# Patient Record
Sex: Female | Born: 1995 | Race: Black or African American | Hispanic: No | Marital: Single | Smoking: Never smoker
Health system: Southern US, Community
[De-identification: ages and names within clinical notes are randomized; demographics above are authoritative.]

## PROBLEM LIST (undated history)

## (undated) DIAGNOSIS — Z789 Other specified health status: Secondary | ICD-10-CM

## (undated) DIAGNOSIS — D649 Anemia, unspecified: Secondary | ICD-10-CM

## (undated) HISTORY — PX: NO PAST SURGERIES: SHX2092

---
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
## 2016-03-17 ENCOUNTER — Encounter: Payer: Self-pay | Admitting: Certified Nurse Midwife

## 2016-03-17 ENCOUNTER — Encounter: Payer: Medicaid Other | Admitting: Certified Nurse Midwife

## 2016-03-17 DIAGNOSIS — Z3490 Encounter for supervision of normal pregnancy, unspecified, unspecified trimester: Secondary | ICD-10-CM | POA: Insufficient documentation

## 2016-03-20 LAB — OB RESULTS CONSOLE RUBELLA ANTIBODY, IGM: Rubella: IMMUNE

## 2016-03-20 LAB — OB RESULTS CONSOLE HEPATITIS B SURFACE ANTIGEN: HEP B S AG: NEGATIVE

## 2016-05-05 NOTE — L&D Delivery Note (Signed)
Delivery Note Pt pushed very well for for delivery. At 1:36 PM a viable and healthy female was delivered via Vaginal, Spontaneous Delivery (Presentation: OA; LOT  ).  APGAR: 1, 7; weight P  .   Placenta status: delivered, intact .  Cord: 3V with the following complications: none.  Cord pH: 7.00  NICU assessed and cleared baby after drying, stimulation, PPV.    Anesthesia:  epidural Episiotomy: None Lacerations: 2nd degree;Periurethral Suture Repair: 3.0 vicryl rapide; hemostatic Est. Blood Loss (mL): 400cc  Mom to postpartum.  Baby to Couplet care / Skin to Skin.  Krista Adkins 09/29/2016, 2:01 PM  D/w pt circumcision for female infant including r/b/a.  To contact office  Br/O+/RI/Contra?/Tdap in [facility]

## 2016-07-16 LAB — OB RESULTS CONSOLE RPR: RPR: NONREACTIVE

## 2016-07-16 LAB — OB RESULTS CONSOLE HIV ANTIBODY (ROUTINE TESTING): HIV: NONREACTIVE

## 2016-09-11 LAB — OB RESULTS CONSOLE GBS: STREP GROUP B AG: NEGATIVE

## 2016-09-11 LAB — OB RESULTS CONSOLE GC/CHLAMYDIA
Chlamydia: NEGATIVE
Gonorrhea: NEGATIVE

## 2016-09-29 ENCOUNTER — Inpatient Hospital Stay (HOSPITAL_COMMUNITY)
Admission: AD | Admit: 2016-09-29 | Discharge: 2016-10-01 | DRG: 775 | Disposition: A | Discharge status: Home / Self Care | Payer: Medicaid Other | Source: Ambulatory Visit | Attending: Obstetrics and Gynecology | Admitting: Obstetrics and Gynecology

## 2016-09-29 ENCOUNTER — Inpatient Hospital Stay (HOSPITAL_COMMUNITY): Payer: Medicaid Other | Admitting: Anesthesiology

## 2016-09-29 ENCOUNTER — Encounter (HOSPITAL_COMMUNITY): Payer: Self-pay | Admitting: *Deleted

## 2016-09-29 DIAGNOSIS — Z3A38 38 weeks gestation of pregnancy: Secondary | ICD-10-CM

## 2016-09-29 DIAGNOSIS — D649 Anemia, unspecified: Secondary | ICD-10-CM | POA: Diagnosis present

## 2016-09-29 DIAGNOSIS — O9902 Anemia complicating childbirth: Secondary | ICD-10-CM | POA: Diagnosis present

## 2016-09-29 DIAGNOSIS — O1494 Unspecified pre-eclampsia, complicating childbirth: Secondary | ICD-10-CM | POA: Diagnosis present

## 2016-09-29 HISTORY — DX: Other specified health status: Z78.9

## 2016-09-29 HISTORY — DX: Anemia, unspecified: D64.9

## 2016-09-29 LAB — CBC
HEMATOCRIT: 29.4 % — AB (ref 36.0–46.0)
HEMATOCRIT: 24.2 % — AB (ref 36.0–46.0)
HEMOGLOBIN: 9.1 g/dL — AB (ref 12.0–15.0)
Hemoglobin: 7.7 g/dL — ABNORMAL LOW (ref 12.0–15.0)
MCH: 24.1 pg — ABNORMAL LOW (ref 26.0–34.0)
MCH: 24.9 pg — AB (ref 26.0–34.0)
MCHC: 31 g/dL (ref 30.0–36.0)
MCHC: 31.8 g/dL (ref 30.0–36.0)
MCV: 77.8 fL — AB (ref 78.0–100.0)
MCV: 78.3 fL (ref 78.0–100.0)
Platelets: 251 10*3/uL (ref 150–400)
Platelets: 242 10*3/uL (ref 150–400)
RBC: 3.78 MIL/uL — AB (ref 3.87–5.11)
RBC: 3.09 MIL/uL — ABNORMAL LOW (ref 3.87–5.11)
RDW: 15.7 % — AB (ref 11.5–15.5)
RDW: 15.9 % — AB (ref 11.5–15.5)
WBC: 9.7 10*3/uL (ref 4.0–10.5)
WBC: 15.2 10*3/uL — ABNORMAL HIGH (ref 4.0–10.5)

## 2016-09-29 LAB — TYPE AND SCREEN
ABO/RH(D): O POS
ANTIBODY SCREEN: NEGATIVE

## 2016-09-29 LAB — ABO/RH: ABO/RH(D): O POS

## 2016-09-29 LAB — RPR: RPR Ser Ql: NONREACTIVE

## 2016-09-29 MED ORDER — LIDOCAINE HCL (PF) 1 % IJ SOLN
30.0000 mL | INTRAMUSCULAR | Status: DC | PRN
  Filled 2016-09-29: qty 30

## 2016-09-29 MED ORDER — OXYCODONE-ACETAMINOPHEN 5-325 MG PO TABS: 2.0000 | ORAL_TABLET | ORAL | Status: DC | PRN

## 2016-09-29 MED ORDER — IBUPROFEN 600 MG PO TABS
600.0000 mg | ORAL_TABLET | Freq: Four times a day (QID) | ORAL | Status: DC
  Administered 2016-09-29 – 2016-10-01 (×9): 600 mg via ORAL
  Filled 2016-09-29 (×9): qty 1

## 2016-09-29 MED ORDER — ONDANSETRON HCL 4 MG PO TABS: 4.0000 mg | ORAL_TABLET | ORAL | Status: DC | PRN

## 2016-09-29 MED ORDER — ONDANSETRON HCL 4 MG/2ML IJ SOLN: 4.0000 mg | Freq: Four times a day (QID) | INTRAMUSCULAR | Status: DC | PRN

## 2016-09-29 MED ORDER — OXYTOCIN 40 UNITS IN LACTATED RINGERS INFUSION - SIMPLE MED: 2.5000 [IU]/h | INTRAVENOUS | Status: DC

## 2016-09-29 MED ORDER — OXYTOCIN BOLUS FROM INFUSION
500.0000 mL | Freq: Once | INTRAVENOUS | Status: AC
  Administered 2016-09-29: 500 mL via INTRAVENOUS

## 2016-09-29 MED ORDER — FLEET ENEMA 7-19 GM/118ML RE ENEM: 1.0000 | ENEMA | RECTAL | Status: DC | PRN

## 2016-09-29 MED ORDER — SENNOSIDES-DOCUSATE SODIUM 8.6-50 MG PO TABS
2.0000 | ORAL_TABLET | ORAL | Status: DC
  Administered 2016-09-29 – 2016-09-30 (×2): 2 via ORAL
  Filled 2016-09-29 (×2): qty 2

## 2016-09-29 MED ORDER — SOD CITRATE-CITRIC ACID 500-334 MG/5ML PO SOLN: 30.0000 mL | ORAL | Status: DC | PRN

## 2016-09-29 MED ORDER — OXYTOCIN 40 UNITS IN LACTATED RINGERS INFUSION - SIMPLE MED
1.0000 m[IU]/min | INTRAVENOUS | Status: DC
  Administered 2016-09-29: 2 m[IU]/min via INTRAVENOUS
  Filled 2016-09-29: qty 1000

## 2016-09-29 MED ORDER — DIPHENHYDRAMINE HCL 50 MG/ML IJ SOLN: 12.5000 mg | INTRAMUSCULAR | Status: DC | PRN

## 2016-09-29 MED ORDER — LACTATED RINGERS IV SOLN
INTRAVENOUS | Status: DC
  Administered 2016-09-29 (×2): via INTRAVENOUS

## 2016-09-29 MED ORDER — ONDANSETRON HCL 4 MG/2ML IJ SOLN: 4.0000 mg | INTRAMUSCULAR | Status: DC | PRN

## 2016-09-29 MED ORDER — PHENYLEPHRINE 40 MCG/ML (10ML) SYRINGE FOR IV PUSH (FOR BLOOD PRESSURE SUPPORT)
80.0000 ug | PREFILLED_SYRINGE | INTRAVENOUS | Status: DC | PRN
  Filled 2016-09-29: qty 5

## 2016-09-29 MED ORDER — PRENATAL MULTIVITAMIN CH
1.0000 | ORAL_TABLET | Freq: Every day | ORAL | Status: DC
  Administered 2016-09-30 – 2016-10-01 (×2): 1 via ORAL
  Filled 2016-09-29 (×2): qty 1

## 2016-09-29 MED ORDER — LIDOCAINE HCL (PF) 1 % IJ SOLN
INTRAMUSCULAR | Status: DC | PRN
Start: 2016-09-29 — End: 2016-09-29
  Administered 2016-09-29 (×2): 7 mL via EPIDURAL

## 2016-09-29 MED ORDER — TERBUTALINE SULFATE 1 MG/ML IJ SOLN
0.2500 mg | Freq: Once | INTRAMUSCULAR | Status: DC | PRN
  Filled 2016-09-29: qty 1

## 2016-09-29 MED ORDER — HYDRALAZINE HCL 20 MG/ML IJ SOLN: 10.0000 mg | Freq: Once | INTRAMUSCULAR | Status: DC | PRN

## 2016-09-29 MED ORDER — SIMETHICONE 80 MG PO CHEW
80.0000 mg | CHEWABLE_TABLET | ORAL | Status: DC | PRN
Start: 2016-09-29 — End: 2016-10-01

## 2016-09-29 MED ORDER — ZOLPIDEM TARTRATE 5 MG PO TABS: 5.0000 mg | ORAL_TABLET | Freq: Every evening | ORAL | Status: DC | PRN

## 2016-09-29 MED ORDER — COCONUT OIL OIL
1.0000 "application " | TOPICAL_OIL | Status: DC | PRN
  Administered 2016-09-30: 1 via TOPICAL
  Filled 2016-09-29: qty 120

## 2016-09-29 MED ORDER — BUTORPHANOL TARTRATE 1 MG/ML IJ SOLN: 1.0000 mg | INTRAMUSCULAR | Status: DC | PRN

## 2016-09-29 MED ORDER — WITCH HAZEL-GLYCERIN EX PADS: 1.0000 "application " | MEDICATED_PAD | CUTANEOUS | Status: DC | PRN

## 2016-09-29 MED ORDER — OXYCODONE HCL 5 MG PO TABS: 10.0000 mg | ORAL_TABLET | ORAL | Status: DC | PRN

## 2016-09-29 MED ORDER — OXYCODONE-ACETAMINOPHEN 5-325 MG PO TABS: 1.0000 | ORAL_TABLET | ORAL | Status: DC | PRN

## 2016-09-29 MED ORDER — LABETALOL HCL 5 MG/ML IV SOLN
20.0000 mg | INTRAVENOUS | Status: DC | PRN
  Administered 2016-09-29: 20 mg via INTRAVENOUS
  Filled 2016-09-29: qty 4

## 2016-09-29 MED ORDER — OXYCODONE HCL 5 MG PO TABS: 5.0000 mg | ORAL_TABLET | ORAL | Status: DC | PRN

## 2016-09-29 MED ORDER — ACETAMINOPHEN 325 MG PO TABS: 650.0000 mg | ORAL_TABLET | ORAL | Status: DC | PRN

## 2016-09-29 MED ORDER — EPHEDRINE 5 MG/ML INJ
10.0000 mg | INTRAVENOUS | Status: DC | PRN
  Filled 2016-09-29: qty 2

## 2016-09-29 MED ORDER — LACTATED RINGERS IV SOLN: 500.0000 mL | Freq: Once | INTRAVENOUS | Status: DC

## 2016-09-29 MED ORDER — FENTANYL 2.5 MCG/ML BUPIVACAINE 1/10 % EPIDURAL INFUSION (WH - ANES)
14.0000 mL/h | INTRAMUSCULAR | Status: DC | PRN
  Administered 2016-09-29: 14 mL/h via EPIDURAL
  Filled 2016-09-29: qty 100

## 2016-09-29 MED ORDER — BENZOCAINE-MENTHOL 20-0.5 % EX AERO
1.0000 "application " | INHALATION_SPRAY | CUTANEOUS | Status: DC | PRN
  Filled 2016-09-29: qty 56

## 2016-09-29 MED ORDER — LACTATED RINGERS IV SOLN: INTRAVENOUS | Status: DC

## 2016-09-29 MED ORDER — ACETAMINOPHEN 325 MG PO TABS
650.0000 mg | ORAL_TABLET | ORAL | Status: DC | PRN
  Administered 2016-10-01: 650 mg via ORAL
  Filled 2016-09-29: qty 2

## 2016-09-29 MED ORDER — PHENYLEPHRINE 40 MCG/ML (10ML) SYRINGE FOR IV PUSH (FOR BLOOD PRESSURE SUPPORT)
80.0000 ug | PREFILLED_SYRINGE | INTRAVENOUS | Status: DC | PRN
  Filled 2016-09-29: qty 10
  Filled 2016-09-29: qty 5

## 2016-09-29 MED ORDER — DIBUCAINE 1 % RE OINT: 1.0000 "application " | TOPICAL_OINTMENT | RECTAL | Status: DC | PRN

## 2016-09-29 MED ORDER — LACTATED RINGERS IV SOLN: 500.0000 mL | INTRAVENOUS | Status: DC | PRN

## 2016-09-29 MED ORDER — DIPHENHYDRAMINE HCL 25 MG PO CAPS: 25.0000 mg | ORAL_CAPSULE | Freq: Four times a day (QID) | ORAL | Status: DC | PRN

## 2016-09-29 NOTE — MAU Note (Signed)
Pt reports contractions every 5 mins since about 530-600. Pt denies LOF. Reports some vaginal spotting and good fetal movement. Cervix was 3cm on Thursday.

## 2016-09-29 NOTE — Anesthesia Preprocedure Evaluation (Signed)
Anesthesia Evaluation  Patient identified by MRN, date of birth, ID band Patient awake    Reviewed: Allergy & Precautions, H&P , NPO status , Patient's Chart, lab work & pertinent test results  Airway Mallampati: II  TM Distance: >3 FB Neck ROM: full    Dental no notable dental hx.    Pulmonary neg pulmonary ROS,    Pulmonary exam normal breath sounds clear to auscultation       Cardiovascular negative cardio ROS Normal cardiovascular exam Rhythm:regular Rate:Normal     Neuro/Psych negative neurological ROS  negative psych ROS   GI/Hepatic negative GI ROS, Neg liver ROS,   Endo/Other  negative endocrine ROS  Renal/GU negative Renal ROS  negative genitourinary   Musculoskeletal   Abdominal (+) + obese,   Peds  Hematology  (+) Blood dyscrasia, anemia ,   Anesthesia Other Findings   Reproductive/Obstetrics (+) Pregnancy                             Anesthesia Physical Anesthesia Plan  ASA: II  Anesthesia Plan: Epidural   Post-op Pain Management:    Induction:   Airway Management Planned:   Additional Equipment:   Intra-op Plan:   Post-operative Plan:   Informed Consent: I have reviewed the patients History and Physical, chart, labs and discussed the procedure including the risks, benefits and alternatives for the proposed anesthesia with the patient or authorized representative who has indicated his/her understanding and acceptance.     Plan Discussed with:   Anesthesia Plan Comments:         Anesthesia Quick Evaluation

## 2016-09-29 NOTE — H&P (Signed)
Krista Adkins is a 21 y.o. female G1P0 at 38+ with ctx.  Pregnancy complicated by PreEclampsia by 24 hr urine 2 g.  No other sx's.  Presents to MAU without sx's and with cervical change.  Received Tdap in [facility].  Preg dayted by LMP cw 1st trimester [place].  Also + chlamydia had neg TOC.     OB History    Gravida Para Term Preterm AB Living   1             SAB TAB Ectopic Multiple Live Births                G1 present No pap + Chl - neg TOC  Past Medical History:  Diagnosis Date  . Medical history non-contributory   seasonal allergiess  Past Surgical History:  Procedure Laterality Date  . NO PAST SURGERIES     Family History: family history is non contributory.   Social History:  reports that she has never smoked. She has never used smokeless tobacco. She reports that she does not drink alcohol or use drugs. in college, single Meds PNV All NKDA     Maternal Diabetes: No Genetic Screening: Normal Maternal Ultrasounds/Referrals: Normal Fetal Ultrasounds or other Referrals:  None Maternal Substance Abuse:  No Significant Maternal Medications:  None Significant Maternal Lab Results:  Lab values include: Group B Strep negative Other Comments:  None  Review of Systems  Constitutional: Negative.   HENT: Negative.   Eyes: Negative.   Respiratory: Negative.   Cardiovascular: Negative.   Gastrointestinal: Negative.   Genitourinary: Negative.   Musculoskeletal: Positive for back pain.  Skin: Negative.   Neurological: Negative.   Psychiatric/Behavioral: Negative.    Maternal Medical History:  Reason for admission: Contractions.   Contractions: Frequency: irregular.    Fetal activity: Perceived fetal activity is normal.    Prenatal complications: Pre-eclampsia.   Prenatal Complications - Diabetes: none.    Dilation: 4 Effacement (%): 70 Exam by:: Janeth Rase RNC Blood pressure (!) 155/93, pulse 85, temperature 98.3 F (36.8 C), temperature source Oral, resp. rate  17, height 5\' 6"  (1.676 m), weight 93.9 kg (207 lb), SpO2 100 %. Maternal Exam:  Abdomen: Patient reports no abdominal tenderness. Fundal height is appropriate for gestation.   Estimated fetal weight is 6.5-7.5#.   Fetal presentation: vertex  Introitus: Normal vulva. Normal vagina.  Cervix: Cervix evaluated by digital exam.     Physical Exam  Constitutional: She is oriented to person, place, and time. She appears well-developed and well-nourished.  HENT:  Head: Normocephalic and atraumatic.  Cardiovascular: Normal rate and regular rhythm.   Respiratory: Breath sounds normal. No respiratory distress. She has no wheezes.  GI: Soft. Bowel sounds are normal. She exhibits no distension. There is no tenderness.  Musculoskeletal: Normal range of motion.  Neurological: She is alert and oriented to person, place, and time.  Skin: Skin is warm and dry.  Psychiatric: She has a normal mood and affect. Her behavior is normal.    Prenatal labs: ABO, Rh:  O+ Antibody:  neg Rubella:  immune RPR:   NR HBsAg:   neg HIV:   neg GBS:   neg  Hgb 12.2/Plt 361/Ur Cx neg/ GC neg/Chl + - TOC neg/Varicella immune/nl Hgb electro/glucola 153 - nl 3 hr GTT/essential panel neg/nl NT/First trimester screen WNL/US nl anat, post plac, female  OreE with 2 gm protein  Tdap 3/14  Assessment/Plan: 20yo G1 with PreE and labor Monitor BP closely IV pain meds, nitrous  or epidural PRN Expect SVD Augment with AROM/pitocin   Kahil Agner Bovard-Stuckert 09/29/2016, 7:56 AM

## 2016-09-29 NOTE — Anesthesia Pain Management Evaluation Note (Signed)
  CRNA Pain Management Visit Note  Patient: Krista Adkins, 21 y.o., female  "Hello I am a member of the anesthesia team at [facility]. We have an anesthesia team available at all times to provide care throughout the hospital, including epidural management and anesthesia for C-section. I don't know your plan for the delivery whether it a natural birth, water birth, IV sedation, nitrous supplementation, doula or epidural, but we want to meet your pain goals."   1.Was your pain managed to your expectations on prior hospitalizations?   No prior hospitalizations  2.What is your expectation for pain management during this hospitalization?     Epidural  3.How can we help you reach that goal? Epidural when ready.  Record the patient's initial score and the patient's pain goal.   Pain: 6  Pain Goal: 8 [facility] wants you to be able to say your pain was always managed very well.  Meila Berke L 09/29/2016

## 2016-09-29 NOTE — Lactation Note (Signed)
This note was copied from a baby's chart. Lactation Consultation Note  Patient Name: Krista Adkins [id number] Date: 09/29/2016 Reason for consult: Initial assessment  Consult attempted, but Mom has multiple visitors in room. Mom has my # to call when ready for consult.  Lurline Hare [facility] 09/29/2016, 6:36 PM

## 2016-09-29 NOTE — Lactation Note (Addendum)
This note was copied from a baby's chart. Lactation Consultation Note  Patient Name: Boy Kenyell Sirak [id number] Date: 09/29/2016 Reason for consult: Initial assessment  Patient called out for assist. Mom assisted w/positioning. Infant latched w/relative ease, but did require lowering of mandible to widen gape. It was a short feeding, but frequent swallows were noted. Mom was comfortable w/latch. Signs of swallowing discussed.  Mom reports + breast changes w/pregnancy, but her anatomy suggests that her lower quadrants may not be as developed.  Mom made aware of O/P services, breastfeeding support groups, community resources, and our phone # for post-discharge questions. Specifics of an asymmetric latch shown via [employer].  Lurline Hare [facility] 09/29/2016, 8:52 PM

## 2016-09-29 NOTE — Anesthesia Postprocedure Evaluation (Signed)
Anesthesia Post Note  Patient: Krista Adkins  Procedure(s) Performed: * No procedures listed *  Patient location during evaluation: Mother Baby Anesthesia Type: Epidural Level of consciousness: awake and alert and oriented Pain management: satisfactory to patient Vital Signs Assessment: post-procedure vital signs reviewed and stable Respiratory status: spontaneous breathing and nonlabored ventilation Cardiovascular status: stable Postop Assessment: no headache, no backache, no signs of nausea or vomiting, adequate PO intake and patient able to bend at knees (patient up walking) Anesthetic complications: no        Last Vitals:  Vitals:   09/29/16 1700 09/29/16 2057  BP: (!) 141/80 137/67  Pulse: 97 (!) 102  Resp: 18 16  Temp: 37.5 C 37.3 C    Last Pain:  Vitals:   09/29/16 2057  TempSrc: Oral  PainSc: 0-No pain   Pain Goal:                 Oracio Galen

## 2016-09-29 NOTE — Progress Notes (Signed)
Patient ID: Krista Adkins, female   DOB: Aug 23, 1995, 21 y.o.   MRN: [id number]   Comfortable with epidural  AF VSS, some elevated BP 128-153/94-105  gen NAD FHTs 135-140, mod var, + accels, category 1 toco q 3-3min  AROM for clear fluid, w/o diff/comp  SVE 6/90/0  Continue IOL

## 2016-09-29 NOTE — Anesthesia Procedure Notes (Signed)
Epidural Patient location during procedure: OB Start time: 09/29/2016 9:48 AM End time: 09/29/2016 9:52 AM  Staffing Anesthesiologist: Leilani Able Performed: anesthesiologist   Preanesthetic Checklist Completed: patient identified, surgical consent, pre-op evaluation, timeout performed, IV checked, risks and benefits discussed and monitors and equipment checked  Epidural Patient position: sitting Prep: site prepped and draped and DuraPrep Patient monitoring: continuous pulse ox and blood pressure Approach: midline Location: L3-L4 Injection technique: LOR air  Needle:  Needle type: Tuohy  Needle gauge: 17 G Needle length: 9 cm and 9 Needle insertion depth: 6 cm Catheter type: closed end flexible Catheter size: 19 Gauge Catheter at skin depth: 12 cm Test dose: negative and Other  Assessment Sensory level: T9 Events: blood not aspirated, injection not painful, no injection resistance, negative IV test and no paresthesia  Additional Notes Reason for block:procedure for pain

## 2016-09-29 NOTE — Progress Notes (Signed)
First time OOB to bathroom with assistance, Voided and emptied bladder  wtihout difficulty, peri care given.  No further c/o

## 2016-09-30 LAB — CBC
HCT: 21.2 % — ABNORMAL LOW (ref 36.0–46.0)
HEMOGLOBIN: 6.9 g/dL — AB (ref 12.0–15.0)
MCH: 25.3 pg — AB (ref 26.0–34.0)
MCHC: 32.5 g/dL (ref 30.0–36.0)
MCV: 77.7 fL — AB (ref 78.0–100.0)
Platelets: 202 10*3/uL (ref 150–400)
RBC: 2.73 MIL/uL — ABNORMAL LOW (ref 3.87–5.11)
RDW: 16.1 % — AB (ref 11.5–15.5)
WBC: 13.5 10*3/uL — ABNORMAL HIGH (ref 4.0–10.5)

## 2016-09-30 MED ORDER — FERROUS SULFATE 325 (65 FE) MG PO TABS
325.0000 mg | ORAL_TABLET | Freq: Two times a day (BID) | ORAL | Status: DC
  Administered 2016-09-30 – 2016-10-01 (×3): 325 mg via ORAL
  Filled 2016-09-30 (×3): qty 1

## 2016-09-30 NOTE — Progress Notes (Signed)
Patient ID: Krista Adkins, female   DOB: [birth year], 21 y.o.   MRN: [id number] Pt doing well. Sore - pain controlled with ibuprofen. No fever, chills, HA or blurry vision. She is bonding well with baby. Ambulating and voiding well. Lochia moderate VS 130-145/83-84 ABD - soft, FF EXT - +1 edema b/l, no homans  13.5>6.9<202  A/P:PPD#1 sp svd - anemic and mildly elev bp but stable       Will start on oral meds for BP if SBP>150 or DBP>90       Will do circumcision for baby later today        Routine pp care- likely discharge to home tomorrow

## 2016-09-30 NOTE — Progress Notes (Signed)
Critical hgb 6.9. Dr. Hinton Rao paged. Patient asymptomatic.

## 2016-09-30 NOTE — Plan of Care (Signed)
Problem: Education: Goal: Knowledge of the prescribed therapeutic regimen will improve Outcome: Progressing Patient has been resting and napping at regular intervals today per recommendations. Patient verbalizes understanding to report headaches, visual disturbances, nausea, epigastric discomfort, and/or vomiting to the nursing staff. Patient's family supportive of this prescribed regimen.

## 2016-09-30 NOTE — Lactation Note (Signed)
This note was copied from a baby's chart. Lactation Consultation Note  Patient Name: Krista Adkins [id number] Date: 09/30/2016 Reason for consult: Follow-up assessment Baby at 19 hr of life and dyad set for d/c today. Upon entry baby had a shallow latch. When baby came off the breast there was a significant compression stripe but mom denies breast or nipple pain. Demonstrated cross cradle and baby was able to take in more of the areola. Discussed baby behavior, feeding frequency, pumping, milk handling, voids, wt loss, breast changes, and nipple care. Mom is aware of lactation services and support group. NT to get family a list of providers. Mom to call for lactation as needed.    Maternal Data    Feeding Feeding Type: Breast Fed Length of feed: 20 min  LATCH Score/Interventions Latch: Repeated attempts needed to sustain latch, nipple held in mouth throughout feeding, stimulation needed to elicit sucking reflex. Intervention(s): Adjust position;Assist with latch  Audible Swallowing: A few with stimulation Intervention(s): Skin to skin;Hand expression  Type of Nipple: Everted at rest and after stimulation  Comfort (Breast/Nipple): Soft / non-tender     Hold (Positioning): Assistance needed to correctly position infant at breast and maintain latch.  LATCH Score: 7  Lactation Tools Discussed/Used     Consult Status Consult Status: Complete Follow-up type: Call as needed    Rulon Eisenmenger 09/30/2016, 9:20 AM

## 2016-09-30 NOTE — Progress Notes (Signed)
UR chart review completed.  

## 2016-10-01 MED ORDER — IBUPROFEN 600 MG PO TABS: 600.0000 mg | ORAL_TABLET | Freq: Four times a day (QID) | ORAL | 0 refills | Status: AC

## 2016-10-01 NOTE — Lactation Note (Signed)
This note was copied from a baby's chart. Lactation Consultation Note  Patient Name: Krista Adkins [id number] Date: 10/01/2016 Reason for consult: Follow-up assessment    With this mom of a term infant, now 34 hours old. Mom was breast feeding in side lying position when I walked in the room. Mom denied pain with latch, and visible swallows not with a steady sucking pattern noted. Mom's breast full, soft, and easily expressed transitional milk notes. Breast care, engorgement care reviewed. Mom knows to call for questions/conerns.    Maternal Data    Feeding Feeding Type: Breast Fed Length of feed:  (baby latched when I walked in the room)  LATCH Score/Interventions Latch: Grasps breast easily, tongue down, lips flanged, rhythmical sucking. Intervention(s): Breast massage;Breast compression;Assist with latch;Adjust position  Audible Swallowing: Spontaneous and intermittent Intervention(s): Hand expression;Skin to skin;Alternate breast massage  Type of Nipple: Everted at rest and after stimulation  Comfort (Breast/Nipple): Soft / non-tender  Problem noted: Mild/Moderate discomfort Interventions  (Cracked/bleeding/bruising/blister): Expressed breast milk to nipple;Other (comment) (coconut oil) Interventions (Mild/moderate discomfort): Hand expression;Hand massage  Hold (Positioning): Assistance needed to correctly position infant at breast and maintain latch. (MGM helped mom latch in side lying) Intervention(s): Support Pillows;Position options;Skin to skin;Breastfeeding basics reviewed  LATCH Score: 9  Lactation Tools Discussed/Used     Consult Status Consult Status: Complete Follow-up type: Call as needed    Alfred Levins 10/01/2016, 10:41 AM

## 2016-10-01 NOTE — Lactation Note (Addendum)
This note was copied from a baby's chart. Lactation Consultation Note Baby not satisfied at breast.acting like he is starving. Mom feeding in lay back position. She has fed in cradle, and football position. Was circumcised during the day. Has vasoline in diaper.  Mom has everted nipples w/bulbous areolas. Hand expressed easy pour of colostrum. Expressed 10ml.  Gave to baby via syring and gloved finger. Noted has upper labial frenulum down to bottom of upper gum w/gum arching in center. Appears may have posterior frenulum.  Baby passing gas, questions may have gas pain. Hasn't stooled since 1500 09/30/16.  Output 7 voids and 6 stools in 39 hrs. Of life.  Gave mom vial, taught hand expression to collect more colostrum to finger feed baby.  Mom's nipples are bruised and tender. Skin intact at this time. Wearing coconut oil.  MGM holding and burping baby while mom hand expresses.  Patient Name: Krista Adkins Date: 10/01/2016 Reason for consult: Follow-up assessment   Maternal Data    Feeding Feeding Type: Breast Milk Length of feed: 60 min  LATCH Score/Interventions Latch: Grasps breast easily, tongue down, lips flanged, rhythmical sucking. Intervention(s): Adjust position;Assist with latch;Breast massage;Breast compression  Audible Swallowing: Spontaneous and intermittent Intervention(s): Hand expression;Alternate breast massage  Type of Nipple: Everted at rest and after stimulation  Comfort (Breast/Nipple): Filling, red/small blisters or bruises, mild/mod discomfort  Problem noted: Mild/Moderate discomfort;Cracked, bleeding, blisters, bruises Interventions  (Cracked/bleeding/bruising/blister): Expressed breast milk to nipple Interventions (Mild/moderate discomfort): Hand massage;Hand expression  Hold (Positioning): Assistance needed to correctly position infant at breast and maintain latch. Intervention(s): Breastfeeding basics reviewed;Support Pillows;Position  options;Skin to skin  LATCH Score: 8  Lactation Tools Discussed/Used     Consult Status Consult Status: Follow-up Date: 10/01/16 Follow-up type: In-patient    Charyl Dancer 10/01/2016, 4:58 AM

## 2016-10-01 NOTE — Progress Notes (Signed)
Post Partum Day 2 Subjective: tolerating PO Working on breastfeeding and tired.  Pain controlled  Objective: Blood pressure 136/83, pulse 88, temperature 98.2 F (36.8 C), temperature source Oral, resp. rate 18, height 5\' 6"  (1.676 m), weight 93.9 kg (207 lb), SpO2 100 %, unknown if currently breastfeeding.  Physical Exam:  General: alert and cooperative Lochia: appropriate Uterine Fundus: firm    Recent Labs  09/29/16 1556 09/30/16 0529  HGB 7.7* 6.9*  HCT 24.2* 21.2*    Assessment/Plan: Discharge home  Baby with fractured clavicle, likely cause of tachypnea, peds will d/w with them further   LOS: 2 days   Oliver Pila 10/01/2016, 9:54 AM

## 2016-10-01 NOTE — Lactation Note (Signed)
This note was copied from a baby's chart. Lactation Consultation Note Baby cluster feeding. Mom's nipples sore. Noted Rt. Nipple that baby just fed on pinched. Mom had BF in cradle position. Assisted in football hold, chin tug demonstrated w/relief from pinching noted. No bruising noted. Mom has coconut oil. Encouraged to use.  Discussed props, body alignment, comfort for mom. Educated on cluster feeding, I&O, supply and demand. Patient Name: Boy Tayvia Faughnan [id number] Date: 10/01/2016 Reason for consult: Follow-up assessment;Breast/nipple pain   Maternal Data    Feeding Feeding Type: Breast Fed Length of feed: 10 min (still BF)  LATCH Score/Interventions Latch: Grasps breast easily, tongue down, lips flanged, rhythmical sucking. Intervention(s): Adjust position;Assist with latch;Breast massage;Breast compression  Audible Swallowing: A few with stimulation Intervention(s): Hand expression;Alternate breast massage  Type of Nipple: Everted at rest and after stimulation  Comfort (Breast/Nipple): Filling, red/small blisters or bruises, mild/mod discomfort  Problem noted: Mild/Moderate discomfort Interventions (Mild/moderate discomfort): Hand massage;Hand expression (coconut oil)  Hold (Positioning): Assistance needed to correctly position infant at breast and maintain latch. Intervention(s): Breastfeeding basics reviewed;Support Pillows;Position options;Skin to skin  LATCH Score: 7  Lactation Tools Discussed/Used     Consult Status Consult Status: Follow-up Date: 10/01/16 Follow-up type: In-patient    Needham Biggins, Diamond Nickel 10/01/2016, 1:53 AM

## 2016-10-01 NOTE — Discharge Summary (Signed)
OB Discharge Summary     Patient Name: Krista Adkins DOB: Oct 06, 1995 MRN: [id number]  Date of admission: 09/29/2016 Delivering MD: Sherian Rein   Date of discharge: 10/01/2016  Admitting diagnosis: 38WKS CONTRACTIONS 3-4MINS  Intrauterine pregnancy: 101w3d     Secondary diagnosis:  Principal Problem:   SVD (spontaneous vaginal delivery) Active Problems:   Indication for care in labor or delivery  Additional problems: proteinuria/preeclampsia     anemia  Discharge diagnosis: Term Pregnancy Delivered                                                                                                Post partum procedures:none  Augmentation: AROM and Pitocin  Complications: None  Hospital course:  Onset of Labor With Vaginal Delivery     21 y.o. yo G1P1001 at [redacted]w[redacted]d was admitted in Active Labor on 09/29/2016. Patient had an uncomplicated labor course as follows:  Membrane Rupture Time/Date: 10:29 AM ,09/29/2016   Intrapartum Procedures: Episiotomy: None [1]                                         Lacerations:  2nd degree [3];Periurethral [8]  Patient had a delivery of a Viable infant. 09/29/2016  Information for the patient's newborn:  Krista, Adkins [[id number]]  Delivery Method: Vaginal, Spontaneous Delivery (Filed from Delivery Summary)    Pateint had an uncomplicated postpartum course.  She is ambulating, tolerating a regular diet, passing flatus, and urinating well. Patient is discharged home in stable condition on 10/01/16.   Physical exam  Vitals:   09/30/16 0600 09/30/16 1001 09/30/16 1730 10/01/16 0508  BP: 130/84 (!) 145/84 (!) 141/77 136/83  Pulse: 83 78 (!) 101 88  Resp:  18 18 18   Temp:   98.7 F (37.1 C) 98.2 F (36.8 C)  TempSrc:   Oral Oral  SpO2:  100%    Weight:      Height:       General: alert and cooperative Lochia: appropriate Uterine Fundus: firm  Labs: Lab Results  Component Value Date   WBC 13.5 (H) 09/30/2016   HGB 6.9 (LL)  09/30/2016   HCT 21.2 (L) 09/30/2016   MCV 77.7 (L) 09/30/2016   PLT 202 09/30/2016   No flowsheet data found.  Discharge instruction: per After Visit Summary and "Baby and Me Booklet".  After visit meds:  Allergies as of 10/01/2016   No Known Allergies     Medication List    TAKE these medications   acetaminophen 325 MG tablet Commonly known as:  TYLENOL Take 650 mg by mouth every 6 (six) hours as needed for mild pain or headache.   diphenhydrAMINE 25 MG tablet Commonly known as:  BENADRYL Take 25 mg by mouth every 6 (six) hours as needed for allergies.   ferrous sulfate 325 (65 FE) MG tablet Take 325 mg by mouth every other day.   ibuprofen 600 MG tablet Commonly known as:  ADVIL,MOTRIN Take 1 tablet (600 mg total) by mouth  every 6 (six) hours.   prenatal multivitamin Tabs tablet Take 1 tablet by mouth daily at 12 noon.       Diet: routine diet  Activity: Advance as tolerated. Pelvic rest for 6 weeks.   Outpatient follow up:3 weeks Follow up Appt:No future appointments. Follow up Visit:No Follow-up on file.  Postpartum contraception: Nexplanon  Newborn Data: Live born female  Birth Weight: 9 lb 4.9 oz (4220 g) APGAR: 1, 7  Baby Feeding: Breast Disposition:home with mother if pediatrician clears for discharge    10/01/2016 Oliver Pila, MD

## 2016-10-02 ENCOUNTER — Ambulatory Visit: Payer: Self-pay

## 2016-10-02 NOTE — Lactation Note (Signed)
This note was copied from a baby's chart. Lactation Consultation Note  Patient Name: Boy Dorlis Pompa [id number] Date: 10/02/2016   Visited with Mom on day of discharge, baby 78 hrs old, 8% weight loss, 4 voids and 3 stools (yellow) last 24 hrs.  Mom resting on her side with baby against her chest STS. Mom states baby had just fed at 0630.  Mom denies any pain with latch, other than some blistering on nipples from first day.  Mom using coconut oil.  Breasts full but soft to palpation. Offered assist, Mom declined. Recommended she call for latch assessment at next feeding by her RN or LC prior to discharge. Reinforced importance of frequent feedings, goal of 8-12 feedings per 24 hrs.  Expressed breast milk in bottle in room.  Demonstrated how to use pump parts for [employer] as a manual breast pump.  Talked about normal cluster feeding behavior.  Engorgement prevention and treatment discussed.   OP lactation services discussed with Mom, encouraged her to call prn for assistance.  Judee Clara 10/02/2016, 8:55 AM

## 2017-01-14 ENCOUNTER — Emergency Department
Admission: EM | Admit: 2017-01-14 | Discharge: 2017-01-15 | Disposition: A | Discharge status: Home / Self Care | Payer: Medicaid Other | Attending: Emergency Medicine | Admitting: Emergency Medicine

## 2017-01-14 ENCOUNTER — Emergency Department: Payer: Medicaid Other

## 2017-01-14 ENCOUNTER — Encounter: Payer: Self-pay | Admitting: Emergency Medicine

## 2017-01-14 DIAGNOSIS — Z79899 Other long term (current) drug therapy: Secondary | ICD-10-CM | POA: Insufficient documentation

## 2017-01-14 DIAGNOSIS — M545 Low back pain: Secondary | ICD-10-CM | POA: Diagnosis present

## 2017-01-14 DIAGNOSIS — M5442 Lumbago with sciatica, left side: Secondary | ICD-10-CM

## 2017-01-14 DIAGNOSIS — M5432 Sciatica, left side: Secondary | ICD-10-CM

## 2017-01-14 LAB — POCT PREGNANCY, URINE: PREG TEST UR: NEGATIVE

## 2017-01-14 MED ORDER — PREDNISONE 20 MG PO TABS
40.0000 mg | ORAL_TABLET | Freq: Once | ORAL | Status: AC
  Administered 2017-01-14: 40 mg via ORAL
  Filled 2017-01-14: qty 2

## 2017-01-14 MED ORDER — KETOROLAC TROMETHAMINE 30 MG/ML IJ SOLN
60.0000 mg | Freq: Once | INTRAMUSCULAR | Status: AC
  Administered 2017-01-14: 60 mg via INTRAMUSCULAR
  Filled 2017-01-14: qty 2

## 2017-01-14 MED ORDER — DIAZEPAM 2 MG PO TABS
2.0000 mg | ORAL_TABLET | Freq: Once | ORAL | Status: AC
  Administered 2017-01-14: 2 mg via ORAL
  Filled 2017-01-14: qty 1

## 2017-01-14 MED ORDER — HYDROCODONE-ACETAMINOPHEN 5-325 MG PO TABS
1.0000 | ORAL_TABLET | Freq: Once | ORAL | Status: AC
  Administered 2017-01-14: 1 via ORAL
  Filled 2017-01-14: qty 1

## 2017-01-14 NOTE — ED Notes (Signed)
Patient saw PCP about 2 weeks ago dx with pinched nerve in hip/back area. Patient states pain got so bad tonight she fell in the floor hurting.   Patients pulses and sensation is intact bilaterally. Patient able to wiggle toes and bend at the knee which is painful.

## 2017-01-14 NOTE — ED Triage Notes (Signed)
Patient with complaint of pain to left lower back radiating down her left leg. Patient was seen at urgent care about 2 weeks ago and was told that it was nerve pain. Patient states that the pain is worse tonight. Patient reports that she took IBU and the prescribed medication about 2 hours ago.

## 2017-01-14 NOTE — ED Provider Notes (Signed)
[facility] Emergency Department Provider Note   ____________________________________________   First MD Initiated Contact with Patient 01/14/17 2311     (approximate)  I have reviewed the triage vital signs and the nursing notes.   HISTORY  Chief Complaint Back Pain and Leg Pain    HPI Krista Adkins is a 21 y.o. female who presents to the ED from home with a chief complaint of nontraumatic low back pain.patient was seen at urgent care about 2 weeks ago for same and told that it was nerve pain. She was started on ibuprofen, gabapentinand amitriptyline. Pain increased this evening. Denies fall/injury/trauma. Complains of midline low back pain radiating to her left leg associated with numbness. Denies extremity weakness, bowel or bladder incontinence. Tried taking her medicines without relief of symptoms. Patient is exacerbated by movement. Denies recent fever, chills, chest pain, shortness of breath, abdominal pain, nausea, vomiting. Denies recent travel. Patient is 4 months postpartum, not breast-feeding.   Past Medical History:  Diagnosis Date  . Anemia   . Medical history non-contributory   . SVD (spontaneous vaginal delivery) 09/29/2016    Patient Active Problem List   Diagnosis Date Noted  . Indication for care in labor or delivery 09/29/2016  . SVD (spontaneous vaginal delivery) 09/29/2016  . Supervision of normal pregnancy, antepartum 03/17/2016    Past Surgical History:  Procedure Laterality Date  . NO PAST SURGERIES      Prior to Admission medications   Medication Sig Start Date End Date Taking? Authorizing Provider  acetaminophen (TYLENOL) 325 MG tablet Take 650 mg by mouth every 6 (six) hours as needed for mild pain or headache.    [provider]  diphenhydrAMINE (BENADRYL) 25 MG tablet Take 25 mg by mouth every 6 (six) hours as needed for allergies.    [provider]  ferrous sulfate 325 (65 FE) MG tablet Take 325  mg by mouth every other day.    [provider]  ibuprofen (ADVIL,MOTRIN) 600 MG tablet Take 1 tablet (600 mg total) by mouth every 6 (six) hours. 10/01/16   Huel Cote, MD  Prenatal Vit-Fe Fumarate-FA (PRENATAL MULTIVITAMIN) TABS tablet Take 1 tablet by mouth daily at 12 noon.    [provider]    Allergies Patient has no known allergies.  No family history on file.  Social History Social History  Substance Use Topics  . Smoking status: Never Smoker  . Smokeless tobacco: Never Used  . Alcohol use No    Review of Systems  Constitutional: No fever/chills. Eyes: No visual changes. ENT: No sore throat. Cardiovascular: Denies chest pain. Respiratory: Denies shortness of breath. Gastrointestinal: No abdominal pain.  No nausea, no vomiting.  No diarrhea.  No constipation. Genitourinary: Negative for dysuria. Musculoskeletal: positive for back pain. Skin: Negative for rash. Neurological: Negative for headaches, focal weakness or numbness.   ____________________________________________   PHYSICAL EXAM:  VITAL SIGNS: ED Triage Vitals  Enc Vitals Group     BP 01/14/17 2018 (!) 141/93     Pulse Rate 01/14/17 2018 (!) 110     Resp 01/14/17 2018 20     Temp 01/14/17 2018 98.2 F (36.8 C)     Temp Source 01/14/17 2018 Oral     SpO2 01/14/17 2018 100 %     Weight 01/14/17 2019 163 lb (73.9 kg)     Height 01/14/17 2019 5\' 7"  (1.702 m)     Head Circumference --      Peak Flow --  Pain Score 01/14/17 2018 10     Pain Loc --      Pain Edu? --      Excl. in GC? --     Constitutional: Alert and oriented. Well appearing and in no acute distress. Eyes: Conjunctivae are normal. PERRL. EOMI. Head: Atraumatic. Nose: No congestion/rhinnorhea. Mouth/Throat: Mucous membranes are moist.  Oropharynx non-erythematous. Neck: No stridor.  No cervical spine tenderness to palpation. Cardiovascular: Normal rate, regular rhythm. Grossly normal heart sounds.  Good  peripheral circulation. Respiratory: Normal respiratory effort.  No retractions. Lungs CTAB. Gastrointestinal: Soft and nontender. No distention. No abdominal bruits. No CVA tenderness. Musculoskeletal: Mild lumbar spine tenderness to palpation. No step-offs or deformities noted. Left paraspinal lumbar muscle spasms. No lower extremity tenderness nor edema.  SLR positive on the left at 45. No joint effusions. Neurologic:  Normal speech and language. No gross focal neurologic deficits are appreciated.  Skin:  Skin is warm, dry and intact. No rash noted. Psychiatric: Mood and affect are normal. Speech and behavior are normal.  ____________________________________________   LABS (all labs ordered are listed, but only abnormal results are displayed)  Labs Reviewed - No data to display ____________________________________________  EKG  none ____________________________________________  RADIOLOGY  No results found.  ____________________________________________   PROCEDURES  Procedure(s) performed: None  Procedures  Critical Care performed: No  ____________________________________________   INITIAL IMPRESSION / ASSESSMENT AND PLAN / ED COURSE  Pertinent labs & imaging results that were available during my care of the patient were reviewed by me and considered in my medical decision making (see chart for details).  21 year old female who presents with left sciatica. Seen at urgent care 2 weeks ago for same. No imaging studies performed at that time; will perform x-rays tonight. Administer NSAIDs, analgesia, muscle relaxer and steroid. Will reassess.  Clinical Course as of Jan 16 627  Thu Jan 15, 2017  4782 Patient is feeling much better. At the patient and family members of x-ray results. Strict return precautions given. All verbalize understanding and agree with plan of care.  [JS]    Clinical Course User Index [JS] Irean Hong, MD      ____________________________________________   FINAL CLINICAL IMPRESSION(S) / ED DIAGNOSES  Final diagnoses:  Sciatica of left side  Acute left-sided low back pain with left-sided sciatica      NEW MEDICATIONS STARTED DURING THIS VISIT:  New Prescriptions   No medications on file     Note:  This document was prepared using Dragon voice recognition software and may include unintentional dictation errors.    Irean Hong, MD 01/15/17 [phone]

## 2017-01-14 NOTE — ED Notes (Signed)
POCT preg. Negative

## 2017-01-15 MED ORDER — DIAZEPAM 2 MG PO TABS: 2.0000 mg | ORAL_TABLET | Freq: Three times a day (TID) | ORAL | 0 refills | Status: DC | PRN

## 2017-01-15 MED ORDER — METHYLPREDNISOLONE 4 MG PO TBPK: ORAL_TABLET | ORAL | 0 refills | Status: DC

## 2017-01-15 MED ORDER — HYDROCODONE-ACETAMINOPHEN 5-325 MG PO TABS: 1.0000 | ORAL_TABLET | Freq: Four times a day (QID) | ORAL | 0 refills | Status: DC | PRN

## 2017-01-15 NOTE — Discharge Instructions (Signed)
1. Take steroid taper as prescribed (Medrol Dosepak). 2. Continue ibuprofen as previously prescribed. 3. You may take medicines as needed for pain and muscle spasms (Norco/Valium #15). 4. Apply moist heat to affected area several times daily. 5. Return to the ER for worsening symptoms, persistent vomiting, difficulty breathing, losing control of your bowel or bladder, or other concerns.

## 2017-01-15 NOTE — ED Notes (Signed)
Patient verbalizes understanding of d/c instructions and follow-up. VS stable and pain controlled per patient.  Patient in NAD at time of d/c and denies further concerns regarding this visit. Patient stable at the time of departure from the unit, departing unit by the safest and most appropriate manner per that patients condition and limitations. Patient advised to return to the ED at any time for emergent concerns, or for new/worsening symptoms.   

## 2018-01-07 ENCOUNTER — Other Ambulatory Visit: Payer: Self-pay

## 2018-01-07 ENCOUNTER — Emergency Department
Admission: EM | Admit: 2018-01-07 | Discharge: 2018-01-07 | Disposition: A | Discharge status: Home / Self Care | Payer: Medicaid Other | Attending: Emergency Medicine | Admitting: Emergency Medicine

## 2018-01-07 ENCOUNTER — Encounter: Payer: Self-pay | Admitting: Emergency Medicine

## 2018-01-07 DIAGNOSIS — Z79899 Other long term (current) drug therapy: Secondary | ICD-10-CM | POA: Diagnosis not present

## 2018-01-07 DIAGNOSIS — J020 Streptococcal pharyngitis: Secondary | ICD-10-CM | POA: Diagnosis not present

## 2018-01-07 DIAGNOSIS — J029 Acute pharyngitis, unspecified: Secondary | ICD-10-CM | POA: Diagnosis present

## 2018-01-07 LAB — POCT PREGNANCY, URINE: PREG TEST UR: NEGATIVE

## 2018-01-07 MED ORDER — AMOXICILLIN 875 MG PO TABS: 875.0000 mg | ORAL_TABLET | Freq: Two times a day (BID) | ORAL | 0 refills | Status: AC

## 2018-01-07 MED ORDER — ACETAMINOPHEN 325 MG PO TABS
650.0000 mg | ORAL_TABLET | Freq: Once | ORAL | Status: AC
  Administered 2018-01-07: 650 mg via ORAL
  Filled 2018-01-07: qty 2

## 2018-01-07 MED ORDER — AMOXICILLIN 500 MG PO CAPS
500.0000 mg | ORAL_CAPSULE | Freq: Three times a day (TID) | ORAL | Status: DC
  Administered 2018-01-07: 500 mg via ORAL
  Filled 2018-01-07: qty 1

## 2018-01-07 NOTE — ED Triage Notes (Signed)
Pt reports that she has not been able to hear out of her left ear for about a month, and her throat began hurting a few days ago. Pt reports that she also developed some diarrhea and just does not feel well.

## 2018-01-07 NOTE — ED Provider Notes (Signed)
[facility] Emergency Department Provider Note  ____________________________________________  Time seen: Approximately 11:42 PM  I have reviewed the triage vital signs and the nursing notes.   HISTORY  Chief Complaint Otalgia; Sore Throat; and Diarrhea    HPI Krista Adkins is a 22 y.o. female presents to the emergency department with 2 days of 8/10 aching pharyngitis, fever, headache and abdominal discomfort.  No rhinorrhea, congestion or nonproductive cough.  Patient does not know of sick contacts with similar symptoms.  Patient does have numerous contacts at school.  Patient is tolerating fluids by mouth and her own secretions.   Past Medical History:  Diagnosis Date  . Anemia   . Medical history non-contributory   . SVD (spontaneous vaginal delivery) 09/29/2016    Patient Active Problem List   Diagnosis Date Noted  . Indication for care in labor or delivery 09/29/2016  . SVD (spontaneous vaginal delivery) 09/29/2016  . Supervision of normal pregnancy, antepartum 03/17/2016    Past Surgical History:  Procedure Laterality Date  . NO PAST SURGERIES      Prior to Admission medications   Medication Sig Start Date End Date Taking? Authorizing Provider  acetaminophen (TYLENOL) 325 MG tablet Take 650 mg by mouth every 6 (six) hours as needed for mild pain or headache.    [provider]  amoxicillin (AMOXIL) 875 MG tablet Take 1 tablet (875 mg total) by mouth 2 (two) times daily for 7 days. 01/07/18 01/14/18  Orvil Feil, PA-C  diazepam (VALIUM) 2 MG tablet Take 1 tablet (2 mg total) by mouth every 8 (eight) hours as needed for muscle spasms. 01/15/17   Irean Hong, MD  diphenhydrAMINE (BENADRYL) 25 MG tablet Take 25 mg by mouth every 6 (six) hours as needed for allergies.    [provider]  ferrous sulfate 325 (65 FE) MG tablet Take 325 mg by mouth every other day.    [provider]  HYDROcodone-acetaminophen (NORCO) 5-325  MG tablet Take 1 tablet by mouth every 6 (six) hours as needed for moderate pain. 01/15/17   Irean Hong, MD  ibuprofen (ADVIL,MOTRIN) 600 MG tablet Take 1 tablet (600 mg total) by mouth every 6 (six) hours. 10/01/16   Huel Cote, MD  methylPREDNISolone (MEDROL DOSEPAK) 4 MG TBPK tablet Take as directed 01/15/17   Irean Hong, MD  Prenatal Vit-Fe Fumarate-FA (PRENATAL MULTIVITAMIN) TABS tablet Take 1 tablet by mouth daily at 12 noon.    [provider]    Allergies Patient has no known allergies.  History reviewed. No pertinent family history.  Social History Social History   Tobacco Use  . Smoking status: Never Smoker  . Smokeless tobacco: Never Used  Substance Use Topics  . Alcohol use: No  . Drug use: No     Review of Systems  Constitutional: No fever/chills Eyes: No visual changes. No discharge ENT: Patient has pharyngitis  Cardiovascular: no chest pain. Respiratory: no cough. No SOB. Gastrointestinal: No abdominal pain.  No nausea, no vomiting.  No diarrhea.  No constipation. Musculoskeletal: Negative for musculoskeletal pain. Skin: Negative for rash, abrasions, lacerations, ecchymosis. Neurological: Negative for headaches, focal weakness or numbness.  ____________________________________________   PHYSICAL EXAM:  VITAL SIGNS: ED Triage Vitals  Enc Vitals Group     BP 01/07/18 1953 115/66     Pulse Rate 01/07/18 1953 94     Resp 01/07/18 1953 20     Temp 01/07/18 1953 (!) 103.1 F (39.5 C)  Temp Source 01/07/18 1953 Oral     SpO2 01/07/18 1953 97 %     Weight 01/07/18 1954 158 lb (71.7 kg)     Height 01/07/18 1954 5\' 6"  (1.676 m)     Head Circumference --      Peak Flow --      Pain Score 01/07/18 1953 7     Pain Loc --      Pain Edu? --      Excl. in GC? --      Constitutional: Alert and oriented. Well appearing and in no acute distress. Eyes: Conjunctivae are normal. PERRL. EOMI. Head: Atraumatic. ENT:      Ears: TMs are  pearly.      Nose: No congestion/rhinnorhea.      Mouth/Throat: Mucous membranes are moist.  Posterior pharynx is erythematous with bilateral tonsillar hypertrophy and exudate.  Uvula is midline. Neck: No stridor.  No cervical spine tenderness to palpation. Hematological/Lymphatic/Immunilogical: Palpable cervical lymphadenopathy. Cardiovascular: Normal rate, regular rhythm. Normal S1 and S2.  Good peripheral circulation. Respiratory: Normal respiratory effort without tachypnea or retractions. Lungs CTAB. Good air entry to the bases with no decreased or absent breath sounds. Skin:  Skin is warm, dry and intact. No rash noted.   ____________________________________________   LABS (all labs ordered are listed, but only abnormal results are displayed)  Labs Reviewed  POCT PREGNANCY, URINE   ____________________________________________  EKG   ____________________________________________  RADIOLOGY   No results found.  ____________________________________________    PROCEDURES  Procedure(s) performed:    Procedures    Medications  amoxicillin (AMOXIL) capsule 500 mg (500 mg Oral Given 01/07/18 2135)  acetaminophen (TYLENOL) tablet 650 mg (650 mg Oral Given 01/07/18 2032)     ____________________________________________   INITIAL IMPRESSION / ASSESSMENT AND PLAN / ED COURSE  Pertinent labs & imaging results that were available during my care of the patient were reviewed by me and considered in my medical decision making (see chart for details).  Review of the [place] CSRS was performed in accordance of the [facility] prior to dispensing any controlled drugs.    Assessment and plan Strep pharyngitis Patient presents to the emergency department with pharyngitis, headache and fever for the past 2 to 3 days.  Patient was treated empirically with amoxicillin.  Patient education regarding the course of strep pharyngitis was given.  Patient was advised to change her toothbrush after  completing amoxicillin.  Vital signs are reassuring prior to discharge.  All patient questions were answered.    ____________________________________________  FINAL CLINICAL IMPRESSION(S) / ED DIAGNOSES  Final diagnoses:  Strep pharyngitis      NEW MEDICATIONS STARTED DURING THIS VISIT:  ED Discharge Orders         Ordered    amoxicillin (AMOXIL) 875 MG tablet  2 times daily     01/07/18 2128              This chart was dictated using voice recognition software/Dragon. Despite best efforts to proofread, errors can occur which can change the meaning. Any change was purely unintentional.    Orvil Feil, PA-C 01/07/18 2346    Myrna Blazer, MD 01/09/18 [phone]

## 2018-07-15 ENCOUNTER — Emergency Department
Admission: EM | Admit: 2018-07-15 | Discharge: 2018-07-15 | Disposition: A | Discharge status: Home / Self Care | Payer: Medicaid Other | Attending: Emergency Medicine | Admitting: Emergency Medicine

## 2018-07-15 ENCOUNTER — Other Ambulatory Visit: Payer: Self-pay

## 2018-07-15 DIAGNOSIS — M545 Low back pain: Secondary | ICD-10-CM | POA: Diagnosis present

## 2018-07-15 DIAGNOSIS — M5442 Lumbago with sciatica, left side: Secondary | ICD-10-CM | POA: Insufficient documentation

## 2018-07-15 DIAGNOSIS — M5432 Sciatica, left side: Secondary | ICD-10-CM

## 2018-07-15 MED ORDER — PREDNISONE 10 MG (21) PO TBPK: ORAL_TABLET | ORAL | 0 refills | Status: DC

## 2018-07-15 NOTE — ED Provider Notes (Signed)
[facility] Emergency Department Provider Note  ____________________________________________  Time seen: Approximately 11:56 PM  I have reviewed the triage vital signs and the nursing notes.   HISTORY  Chief Complaint Back Pain    HPI OZELL FERRERA is a 23 y.o. female presents to the emergency department with acute on chronic low back pain with left lower extremity radiculopathy.  Patient reports that she has experienced similar symptoms in the past but her radiculopathy has been worse since giving birth.  She denies bowel or bladder incontinence or saddle anesthesia.  She denies recent falls or traumas.  Patient has tried ibuprofen at home but states that that medication has not been helping pain.        Past Medical History:  Diagnosis Date  . Anemia   . Medical history non-contributory   . SVD (spontaneous vaginal delivery) 09/29/2016    Patient Active Problem List   Diagnosis Date Noted  . Indication for care in labor or delivery 09/29/2016  . SVD (spontaneous vaginal delivery) 09/29/2016  . Supervision of normal pregnancy, antepartum 03/17/2016    Past Surgical History:  Procedure Laterality Date  . NO PAST SURGERIES      Prior to Admission medications   Medication Sig Start Date End Date Taking? Authorizing Provider  acetaminophen (TYLENOL) 325 MG tablet Take 650 mg by mouth every 6 (six) hours as needed for mild pain or headache.    [provider]  diazepam (VALIUM) 2 MG tablet Take 1 tablet (2 mg total) by mouth every 8 (eight) hours as needed for muscle spasms. 01/15/17   Irean Hong, MD  diphenhydrAMINE (BENADRYL) 25 MG tablet Take 25 mg by mouth every 6 (six) hours as needed for allergies.    [provider]  ferrous sulfate 325 (65 FE) MG tablet Take 325 mg by mouth every other day.    [provider]  HYDROcodone-acetaminophen (NORCO) 5-325 MG tablet Take 1 tablet by mouth every 6 (six) hours as needed for  moderate pain. 01/15/17   Irean Hong, MD  ibuprofen (ADVIL,MOTRIN) 600 MG tablet Take 1 tablet (600 mg total) by mouth every 6 (six) hours. 10/01/16   Huel Cote, MD  methylPREDNISolone (MEDROL DOSEPAK) 4 MG TBPK tablet Take as directed 01/15/17   Irean Hong, MD  predniSONE (STERAPRED UNI-PAK 21 TAB) 10 MG (21) TBPK tablet Take 6 tabs the the 1st day. Take 6 tabs the the 2nd day. Take 5 tabs the the 3rd day. Take 5 tabs the 4th day. Take 4 tabs the the 5th day.Take 4 tabs the the 6th day.Take 3 tabs the 7th day.Take 3 tabs the 8th day. Take 2 tabs the 9th day. Take 2 tabs the 10th day. Take 1 tab the 11th day. Take 1 tab the 12th day. 07/15/18   Orvil Feil, PA-C  Prenatal Vit-Fe Fumarate-FA (PRENATAL MULTIVITAMIN) TABS tablet Take 1 tablet by mouth daily at 12 noon.    [provider]    Allergies Patient has no known allergies.  No family history on file.  Social History Social History   Tobacco Use  . Smoking status: Never Smoker  . Smokeless tobacco: Never Used  Substance Use Topics  . Alcohol use: No  . Drug use: No     Review of Systems  Constitutional: No fever/chills Eyes: No visual changes. No discharge ENT: No upper respiratory complaints. Cardiovascular: no chest pain. Respiratory: no cough. No SOB. Gastrointestinal: No abdominal pain.  No nausea,  no vomiting.  No diarrhea.  No constipation. Genitourinary: Negative for dysuria. No hematuria Musculoskeletal: Patient has low back pain.  Skin: Negative for rash, abrasions, lacerations, ecchymosis. Neurological: Negative for headaches, focal weakness or numbness.   ____________________________________________   PHYSICAL EXAM:  VITAL SIGNS: ED Triage Vitals  Enc Vitals Group     BP 07/15/18 2058 123/64     Pulse Rate 07/15/18 2058 83     Resp 07/15/18 2058 15     Temp 07/15/18 2058 98.7 F (37.1 C)     Temp src --      SpO2 07/15/18 2058 97 %     Weight 07/15/18 2057 165 lb (74.8 kg)      Height 07/15/18 2057 5\' 6"  (1.676 m)     Head Circumference --      Peak Flow --      Pain Score 07/15/18 2057 8     Pain Loc --      Pain Edu? --      Excl. in GC? --      Constitutional: Alert and oriented. Well appearing and in no acute distress. Eyes: Conjunctivae are normal. PERRL. EOMI. Head: Atraumatic. Cardiovascular: Normal rate, regular rhythm. Normal S1 and S2.  Good peripheral circulation. Respiratory: Normal respiratory effort without tachypnea or retractions. Lungs CTAB. Good air entry to the bases with no decreased or absent breath sounds. Gastrointestinal: Bowel sounds 4 quadrants. Soft and nontender to palpation. No guarding or rigidity. No palpable masses. No distention. No CVA tenderness. Musculoskeletal: Full range of motion to all extremities. No gross deformities appreciated.  Patient has paraspinal muscle tenderness along the lumbar spine.  Positive straight leg raise, left. Neurologic:  Normal speech and language. No gross focal neurologic deficits are appreciated.  Skin:  Skin is warm, dry and intact. No rash noted. Psychiatric: Mood and affect are normal. Speech and behavior are normal. Patient exhibits appropriate insight and judgement.   ____________________________________________   LABS (all labs ordered are listed, but only abnormal results are displayed)  Labs Reviewed  POC URINE PREG, ED   ____________________________________________  EKG   ____________________________________________  RADIOLOGY   No results found.  ____________________________________________    PROCEDURES  Procedure(s) performed:    Procedures    Medications - No data to display   ____________________________________________   INITIAL IMPRESSION / ASSESSMENT AND PLAN / ED COURSE  Pertinent labs & imaging results that were available during my care of the patient were reviewed by me and considered in my medical decision making (see chart for  details).  Review of the [place] CSRS was performed in accordance of the [facility] prior to dispensing any controlled drugs.           Assessment and plan Sciatica Patient presents to the emergency department with acute on chronic low back pain with left-sided lower extremity radiculopathy.  Neurologic exam is reassuring.  Patient was discharged with tapered prednisone as she has failed conservative measures with NSAIDs.  Patient was advised to follow-up with primary care as needed.  All patient questions were answered.     ____________________________________________  FINAL CLINICAL IMPRESSION(S) / ED DIAGNOSES  Final diagnoses:  Sciatica of left side      NEW MEDICATIONS STARTED DURING THIS VISIT:  ED Discharge Orders         Ordered    predniSONE (STERAPRED UNI-PAK 21 TAB) 10 MG (21) TBPK tablet     07/15/18 2256  This chart was dictated using voice recognition software/Dragon. Despite best efforts to proofread, errors can occur which can change the meaning. Any change was purely unintentional.    Orvil Feil, PA-C 07/15/18 2359    Nita Sickle, MD 07/19/18 [phone]

## 2018-07-15 NOTE — ED Triage Notes (Signed)
Patient c/o lower back pain, radiating down left leg. Patient reports hx of sciatica - reports this feels the same.

## 2018-07-23 ENCOUNTER — Encounter: Payer: Self-pay | Admitting: Neurology

## 2018-07-23 ENCOUNTER — Other Ambulatory Visit: Payer: Self-pay

## 2018-07-23 ENCOUNTER — Ambulatory Visit: Payer: Medicaid Other | Admitting: Neurology

## 2018-07-23 VITALS — BP 124/72 | HR 73 | Ht 67.0 in | Wt 161.0 lb

## 2018-07-23 DIAGNOSIS — G373 Acute transverse myelitis in demyelinating disease of central nervous system: Secondary | ICD-10-CM | POA: Diagnosis not present

## 2018-07-23 DIAGNOSIS — R202 Paresthesia of skin: Secondary | ICD-10-CM

## 2018-07-23 MED ORDER — GABAPENTIN 100 MG PO CAPS: 100.0000 mg | ORAL_CAPSULE | Freq: Three times a day (TID) | ORAL | 3 refills | Status: DC

## 2018-07-23 MED ORDER — PREDNISONE 10 MG PO TABS: ORAL_TABLET | ORAL | 0 refills | Status: DC

## 2018-07-23 NOTE — Progress Notes (Addendum)
Reason for visit: Numbness  Referring physician: Dr. Lyndon Code Krista Adkins is a 23 y.o. female  History of present illness:  Krista Adkins is a 23 year old right-handed black female with a history of episodic left leg numbness that has been present since 2018.  The patient delivered a child and within 3 or 4 months after delivery she began having problems with numbness involving the left lower extremity.  The numbness would affect the leg only, not on the body.  The episode may come on and last a week or so and then resolve, she will have intermittent episodes that may occur several times a year, the last such event occurred in November 2019.  The patient comes into the office today with onset 8 days ago of her usual numbness going down the left leg, but within 24 hours the numbness began involving both legs and she had numbness on the lower abdomen to just above the umbilicus.  The episode actually started with numbness of the left arm which has never happened before.  The patient does have some back pain, she has some discomfort in the legs associated with the numbness.  She denies issues controlling the bowels or the bladder.  She has had some mild change in balance, she has not had any falls, she is still fully ambulatory.  She reports no numbness on the face, no vision changes, no headache or dizziness or changes in cognitive capacity.  The patient has had routine blood work to include a CBC, comprehensive metabolic profile, and thyroid profile done through her primary care physician, she was told that this blood work was normal.  She is sent to this office for an evaluation.  Past Medical History:  Diagnosis Date   Anemia    Medical history non-contributory    SVD (spontaneous vaginal delivery) 09/29/2016    Past Surgical History:  Procedure Laterality Date   NO PAST SURGERIES      No family history on file.  Social history:  reports that she has never smoked. She has never used  smokeless tobacco. She reports that she does not drink alcohol or use drugs.  Medications:  Prior to Admission medications   Medication Sig Start Date End Date Taking? Authorizing Provider  acetaminophen (TYLENOL) 325 MG tablet Take 650 mg by mouth every 6 (six) hours as needed for mild pain or headache.   Yes [provider]  diphenhydrAMINE (BENADRYL) 25 MG tablet Take 25 mg by mouth every 6 (six) hours as needed for allergies.   Yes [provider]  ferrous sulfate 325 (65 FE) MG tablet Take 325 mg by mouth every other day.   Yes [provider]  ibuprofen (ADVIL,MOTRIN) 600 MG tablet Take 1 tablet (600 mg total) by mouth every 6 (six) hours. 10/01/16  Yes Huel Cote, MD     No Known Allergies  ROS:  Out of a complete 14 system review of symptoms, the patient complains only of the following symptoms, and all other reviewed systems are negative.  Numbness  Blood pressure 124/72, pulse 73, height 5\' 7"  (1.702 m), weight 161 lb (73 kg), unknown if currently breastfeeding.  Physical Exam  General: The patient is alert and cooperative at the time of the examination.  Eyes: Pupils are equal, round, and reactive to light. Discs are flat bilaterally.  Neck: The neck is supple, no carotid bruits are noted.  Respiratory: The respiratory examination is clear.  Cardiovascular: The cardiovascular examination reveals a regular  rate and rhythm, no obvious murmurs or rubs are noted.  Skin: Extremities are without significant edema.  Neurologic Exam  Mental status: The patient is alert and oriented x 3 at the time of the examination. The patient has apparent normal recent and remote memory, with an apparently normal attention span and concentration ability.  Cranial nerves: Facial symmetry is present. There is good sensation of the face to pinprick and soft touch bilaterally. The strength of the facial muscles and the muscles to head turning and shoulder  shrug are normal bilaterally. Speech is well enunciated, no aphasia or dysarthria is noted. Extraocular movements are full. Visual fields are full. The tongue is midline, and the patient has symmetric elevation of the soft palate. No obvious hearing deficits are noted.  Motor: The motor testing reveals 5 over 5 strength of all 4 extremities. Good symmetric motor tone is noted throughout.  Sensory: Sensory testing is intact to pinprick, soft touch, vibration sensation, and position sense on all 4 extremities, with exception of slight decrease in pinprick sensation on the left leg, decreased vibration and position sense in both feet. No evidence of extinction is noted.  Coordination: Cerebellar testing reveals good finger-nose-finger and heel-to-shin bilaterally.  Gait and station: Gait is normal. Tandem gait is normal. Romberg is negative, but is slightly unsteady. No drift is seen.  Reflexes: Deep tendon reflexes are symmetric and normal bilaterally. Toes are downgoing bilaterally.   Assessment/Plan:  1.  Transverse myelitis, T9 level  2.  Left arm numbness  The patient has had episodic numbness of the left leg since 2018 occurring within several months following delivery of a child.  The highest clinical concern is that the patient may have multiple sclerosis.  The patient has had significant worsening of her sensory complaints within the last 8 days.  The patient will be placed on gabapentin for the discomfort.  A short course of prednisone and trial on Mobic and Flexeril did not offer much benefit.  The patient will be given a longer course of prednisone.  She will have blood work done today.  MRI of the brain, cervical spine, and thoracic spine will be done.  She will follow-up here in 3 months.  Krista Palau MD 07/23/2018 8:04 AM  [facility] [address] [place], [place] [address]  Phone [phone] Fax [phone]

## 2018-07-26 ENCOUNTER — Telehealth: Payer: Self-pay | Admitting: Neurology

## 2018-07-26 NOTE — Telephone Encounter (Signed)
Medicaid order sent to [facility]. They will obtain the auth and reach out to the pt to schedule.  °

## 2018-07-27 ENCOUNTER — Telehealth: Payer: Self-pay | Admitting: Neurology

## 2018-07-27 LAB — LYME, WESTERN BLOT, SERUM (REFLEXED)
IGG P39 AB.: ABSENT
IGG P58 AB.: ABSENT
IGG P66 AB.: ABSENT
IGG P93 AB.: ABSENT
IgG P18 Ab.: ABSENT
IgG P23 Ab.: ABSENT
IgG P28 Ab.: ABSENT
IgG P30 Ab.: ABSENT
IgG P45 Ab.: ABSENT
Lyme IgG Wb: NEGATIVE
Lyme IgM Wb: POSITIVE — AB

## 2018-07-27 LAB — VITAMIN B12: Vitamin B-12: 617 pg/mL (ref 232–1245)

## 2018-07-27 LAB — LUPUS ANTICOAGULANT
DPT CONFIRM RATIO: 0.82 ratio (ref 0.00–1.40)
DPT: 34.5 s (ref 0.0–55.0)
Dilute Viper Venom Time: 32.2 s (ref 0.0–47.0)
PTT Lupus Anticoagulant: 42.5 s (ref 0.0–51.9)
Thrombin Time: 16.8 s (ref 0.0–23.0)

## 2018-07-27 LAB — RHEUMATOID FACTOR: RHEUMATOID FACTOR: 11.9 [IU]/mL (ref 0.0–13.9)

## 2018-07-27 LAB — ANA W/REFLEX: ANA: NEGATIVE

## 2018-07-27 LAB — B. BURGDORFI ANTIBODIES: LYME IGG/IGM AB: 1.82 {ISR} — AB (ref 0.00–0.90)

## 2018-07-27 LAB — RPR: RPR Ser Ql: NONREACTIVE

## 2018-07-27 LAB — HIV ANTIBODY (ROUTINE TESTING W REFLEX): HIV Screen 4th Generation wRfx: NONREACTIVE

## 2018-07-27 LAB — SEDIMENTATION RATE: Sed Rate: 30 mm/hr (ref 0–32)

## 2018-07-27 LAB — ANGIOTENSIN CONVERTING ENZYME: ANGIO CONVERT ENZYME: 31 U/L (ref 14–82)

## 2018-07-27 MED ORDER — DOXYCYCLINE HYCLATE 100 MG PO CAPS: 100.0000 mg | ORAL_CAPSULE | Freq: Two times a day (BID) | ORAL | 0 refills | Status: DC

## 2018-07-27 NOTE — Telephone Encounter (Signed)
I called the patient.  Blood work reveals an early Lyme disease infection, IgM panel was positive.  The patient will be given a prescription for doxycycline to take 100 mg twice daily for 14 days.  Otherwise, blood work was unremarkable.

## 2018-08-06 ENCOUNTER — Telehealth: Payer: Self-pay | Admitting: Neurology

## 2018-08-06 DIAGNOSIS — G373 Acute transverse myelitis in demyelinating disease of central nervous system: Secondary | ICD-10-CM

## 2018-08-06 DIAGNOSIS — R202 Paresthesia of skin: Secondary | ICD-10-CM

## 2018-08-06 MED ORDER — GABAPENTIN 300 MG PO CAPS: 300.0000 mg | ORAL_CAPSULE | Freq: Three times a day (TID) | ORAL | 5 refills | Status: DC

## 2018-08-06 NOTE — Telephone Encounter (Signed)
I have called patients, she has to use stop her gabapentin 100 mg tablets, was prescribed by Dr. Anne Hahn since July 23, 2018 for complaints of low back pain, intermittent upper and lower extremity paresthesia and pain,  I prescribed gabapentin 300 mg 3 times a day, 90 tablets with 5 refill

## 2018-09-20 ENCOUNTER — Other Ambulatory Visit: Payer: Medicaid Other

## 2018-09-23 ENCOUNTER — Other Ambulatory Visit: Payer: Medicaid Other

## 2018-09-23 ENCOUNTER — Other Ambulatory Visit: Payer: Self-pay

## 2018-09-23 ENCOUNTER — Ambulatory Visit
Admission: RE | Admit: 2018-09-23 | Discharge: 2018-09-23 | Disposition: A | Discharge status: Home / Self Care | Payer: Medicaid Other | Source: Ambulatory Visit | Attending: Neurology | Admitting: Neurology

## 2018-09-23 DIAGNOSIS — G373 Acute transverse myelitis in demyelinating disease of central nervous system: Secondary | ICD-10-CM

## 2018-09-23 DIAGNOSIS — R202 Paresthesia of skin: Secondary | ICD-10-CM

## 2018-09-23 MED ORDER — GADOBENATE DIMEGLUMINE 529 MG/ML IV SOLN
15.0000 mL | Freq: Once | INTRAVENOUS | Status: AC | PRN
  Administered 2018-09-23: 15 mL via INTRAVENOUS

## 2018-09-28 ENCOUNTER — Telehealth: Payer: Self-pay | Admitting: Neurology

## 2018-09-28 ENCOUNTER — Ambulatory Visit
Admission: RE | Admit: 2018-09-28 | Discharge: 2018-09-28 | Disposition: A | Discharge status: Home / Self Care | Payer: Medicaid Other | Source: Ambulatory Visit | Attending: Neurology | Admitting: Neurology

## 2018-09-28 ENCOUNTER — Other Ambulatory Visit: Payer: Self-pay

## 2018-09-28 DIAGNOSIS — G373 Acute transverse myelitis in demyelinating disease of central nervous system: Secondary | ICD-10-CM | POA: Diagnosis not present

## 2018-09-28 DIAGNOSIS — R202 Paresthesia of skin: Secondary | ICD-10-CM

## 2018-09-28 MED ORDER — PREDNISONE 10 MG PO TABS: ORAL_TABLET | ORAL | 0 refills | Status: DC

## 2018-09-28 MED ORDER — GADOBENATE DIMEGLUMINE 529 MG/ML IV SOLN
15.0000 mL | Freq: Once | INTRAVENOUS | Status: AC | PRN
  Administered 2018-09-28: 15 mL via INTRAVENOUS

## 2018-09-28 NOTE — Telephone Encounter (Signed)
I called the patient.  MRI of the brain was done today, this shows at least 2 periventricular white matter lesions very consistent with multiple sclerosis, the patient has a mildly enhancing cervical cord lesion as well.  The thoracic spinal cord appears to be unremarkable.  The MRI and clinical history are most consistent with multiple sclerosis.  The patient has had some recent worsening of her sensory symptoms, I will treat her with a course of prednisone, we will get a revisit set up to discuss medical therapies for multiple sclerosis.    MRI thoracic 09/23/18:  IMPRESSION: Unremarkable MRI scan of the thoracic spine with and without contrast.   MRI cervical 09/23/18:  IMPRESSION: Abnormal MRI scan of the cervical spine showing spinal cord hyperintensity at C5-C6 showing faint enhancement dorsally most likely compatible with a active demyelinating plaque.

## 2018-09-28 NOTE — Telephone Encounter (Signed)
Dr. Anne Hahn reached out to the pt on 09/28/18 via phone and discussed MRI results. He is requesting pt be scheduled for a face to face visit to discuss MS treatment options.

## 2018-09-28 NOTE — Telephone Encounter (Signed)
Pt is wanting to know if her MRI results are in. Please advise.

## 2018-09-28 NOTE — Telephone Encounter (Signed)
I reached out to the pt and scheduled a face to face visit for 09/30/18 at 8:30 and check in time of 8 am.

## 2018-09-30 ENCOUNTER — Other Ambulatory Visit: Payer: Self-pay

## 2018-09-30 ENCOUNTER — Other Ambulatory Visit: Payer: Self-pay | Admitting: Neurology

## 2018-09-30 ENCOUNTER — Ambulatory Visit (INDEPENDENT_AMBULATORY_CARE_PROVIDER_SITE_OTHER): Payer: Medicaid Other | Admitting: Neurology

## 2018-09-30 ENCOUNTER — Encounter: Payer: Self-pay | Admitting: Neurology

## 2018-09-30 DIAGNOSIS — G35 Multiple sclerosis: Secondary | ICD-10-CM

## 2018-09-30 HISTORY — DX: Multiple sclerosis: G35

## 2018-09-30 NOTE — Progress Notes (Signed)
Reason for visit: Multiple sclerosis  Krista Adkins is an 23 y.o. female  History of present illness:  Krista Adkins is a 23 year old right-handed black female with a history of paresthesias involving the left leg and left arm that began initially in 2018.  The patient has been set up for MRI of the brain, cervical spine, and thoracic spine which revealed evidence of a C5-6 spinal cord enhancing lesion and evidence of bilateral periventricular white matter lesions consistent with a diagnosis of multiple sclerosis.  The patient has just recently had exacerbation of her left-sided numbness, she now has significant numbness of the left neck and left arm with a sensory ataxia of the left arm, and some weakness of the left hand.  This began within the last week, the patient is now on prednisone.  She comes in for discussion regarding medical therapy for multiple sclerosis.  She reports no vision changes, or changes in control the bowels or the bladder.  Past Medical History:  Diagnosis Date  . Anemia   . Medical history non-contributory   . Multiple sclerosis (HCC) 09/30/2018  . SVD (spontaneous vaginal delivery) 09/29/2016    Past Surgical History:  Procedure Laterality Date  . NO PAST SURGERIES      History reviewed. No pertinent family history.  Social history:  reports that she has never smoked. She has never used smokeless tobacco. She reports that she does not drink alcohol or use drugs.   No Known Allergies  Medications:  Prior to Admission medications   Medication Sig Start Date End Date Taking? Authorizing Provider  acetaminophen (TYLENOL) 325 MG tablet Take 650 mg by mouth every 6 (six) hours as needed for mild pain or headache.   Yes [provider]  diphenhydrAMINE (BENADRYL) 25 MG tablet Take 25 mg by mouth every 6 (six) hours as needed for allergies.   Yes [provider]  ferrous sulfate 325 (65 FE) MG tablet Take 325 mg by mouth every other day.   Yes  [provider]  gabapentin (NEURONTIN) 300 MG capsule Take 1 capsule (300 mg total) by mouth 3 (three) times daily. 08/06/18  Yes Levert Feinstein, MD  ibuprofen (ADVIL,MOTRIN) 600 MG tablet Take 1 tablet (600 mg total) by mouth every 6 (six) hours. 10/01/16  Yes Huel Cote, MD  predniSONE (DELTASONE) 10 MG tablet Begin taking 6 tablets daily, taper by one tablet every other day until off the medication. 09/28/18  Yes York Spaniel, MD    ROS:  Out of a complete 14 system review of symptoms, the patient complains only of the following symptoms, and all other reviewed systems are negative.  Left arm numbness, weakness  Blood pressure 109/68, pulse 66, temperature 97.8 F (36.6 C), temperature source Oral, height 5\' 7"  (1.702 m), weight 161 lb (73 kg), unknown if currently breastfeeding.  Physical Exam  General: The patient is alert and cooperative at the time of the examination.  Skin: No significant peripheral edema is noted.   Neurologic Exam  Mental status: The patient is alert and oriented x 3 at the time of the examination. The patient has apparent normal recent and remote memory, with an apparently normal attention span and concentration ability.   Cranial nerves: Facial symmetry is present. Speech is normal, no aphasia or dysarthria is noted. Extraocular movements are full. Visual fields are full.  Motor: The patient has good strength in all 4 extremities, with exception of 4/5 strength with intrinsic muscles of the left  hand and with finger extension of the left hand.  Sensory examination: Soft touch sensation is symmetric on the legs, but is decreased on the left arm and left neck, symmetric on the forehead.  Coordination: The patient has good heel-to-shin bilaterally.  The patient has dysmetria with finger-nose-finger on the left arm, normal on the right.  Gait and station: The patient has a normal gait. Tandem gait is normal. Romberg is negative, but is  unsteady. No drift is seen.  Reflexes: Deep tendon reflexes are symmetric in the legs, but there is some increase in the left biceps reflexes compared the right, triceps reflexes are symmetric.   MRI brain 09/23/18:  IMPRESSION:   Abnormal MRI brain (with and without) demonstrating: -T2 hyperintense lesions in the right centrum semiovale and left periatrial regions, which in combination with cervical spinal cord lesion from prior study are suspicious for chronic demyelinating disease. -No abnormal enhancement noted.  * MRI scan images were reviewed online. I agree with the written report.   MRI cervical 09/23/18:  IMPRESSION: Abnormal MRI scan of the cervical spine showing spinal cord hyperintensity at C5-C6 showing faint enhancement dorsally most likely compatible with a active demyelinating plaque.  * MRI scan images were reviewed online. I agree with the written report.     Assessment/Plan:  1.  Multiple sclerosis  The patient has had a recent MS exacerbation.  Her work-up was delayed secondary to the COVID virus outbreak.  The patient has been placed on prednisone, we will get blood work today, she has signed a form to initiate treatment with Tecfidera.  We will see her back in about 4 months.  Marlan Palau MD 09/30/2018 8:52 AM  [facility] [address] [place], [place] [address]  Phone [phone] Fax [phone]

## 2018-09-30 NOTE — Progress Notes (Signed)
JCV blood test placed in outbox for lab pick up at [facility].

## 2018-10-04 ENCOUNTER — Other Ambulatory Visit: Payer: Medicaid Other

## 2018-10-04 LAB — CBC WITH DIFFERENTIAL/PLATELET
Basophils Absolute: 0.1 10*3/uL (ref 0.0–0.2)
Basos: 1 %
EOS (ABSOLUTE): 0 10*3/uL (ref 0.0–0.4)
Eos: 0 %
Hematocrit: 36.6 % (ref 34.0–46.6)
Hemoglobin: 12 g/dL (ref 11.1–15.9)
Immature Grans (Abs): 0 10*3/uL (ref 0.0–0.1)
Immature Granulocytes: 0 %
Lymphocytes Absolute: 1.9 10*3/uL (ref 0.7–3.1)
Lymphs: 23 %
MCH: 27.8 pg (ref 26.6–33.0)
MCHC: 32.8 g/dL (ref 31.5–35.7)
MCV: 85 fL (ref 79–97)
Monocytes Absolute: 0.4 10*3/uL (ref 0.1–0.9)
Monocytes: 4 %
Neutrophils Absolute: 6.1 10*3/uL (ref 1.4–7.0)
Neutrophils: 72 %
Platelets: 277 10*3/uL (ref 150–450)
RBC: 4.32 x10E6/uL (ref 3.77–5.28)
RDW: 13.1 % (ref 11.7–15.4)
WBC: 8.4 10*3/uL (ref 3.4–10.8)

## 2018-10-04 LAB — COMPREHENSIVE METABOLIC PANEL
ALT: 14 IU/L (ref 0–32)
AST: 21 IU/L (ref 0–40)
Albumin/Globulin Ratio: 1.6 (ref 1.2–2.2)
Albumin: 4.9 g/dL (ref 3.9–5.0)
Alkaline Phosphatase: 50 IU/L (ref 39–117)
BUN/Creatinine Ratio: 22 (ref 9–23)
BUN: 16 mg/dL (ref 6–20)
Bilirubin Total: 0.3 mg/dL (ref 0.0–1.2)
CO2: 22 mmol/L (ref 20–29)
Calcium: 10.1 mg/dL (ref 8.7–10.2)
Chloride: 102 mmol/L (ref 96–106)
Creatinine, Ser: 0.73 mg/dL (ref 0.57–1.00)
GFR calc Af Amer: 136 mL/min/{1.73_m2} (ref >59)
GFR calc non Af Amer: 118 mL/min/{1.73_m2} (ref >59)
Globulin, Total: 3 g/dL (ref 1.5–4.5)
Glucose: 81 mg/dL (ref 65–99)
Potassium: 4.2 mmol/L (ref 3.5–5.2)
Sodium: 141 mmol/L (ref 134–144)
Total Protein: 7.9 g/dL (ref 6.0–8.5)

## 2018-10-04 LAB — VARICELLA ZOSTER ANTIBODY, IGG: Varicella zoster IgG: 490 index (ref >165)

## 2018-10-04 LAB — HEPATITIS B SURFACE ANTIBODY,QUALITATIVE: Hep B Surface Ab, Qual: REACTIVE

## 2018-10-04 LAB — HEPATITIS C ANTIBODY: Hep C Virus Ab: <0.1 s/co ratio (ref 0.0–0.9)

## 2018-10-04 LAB — NEUROMYELITIS OPTICA AUTOAB, IGG: NMO IgG Autoantibodies: <1.5 U/mL (ref 0.0–3.0)

## 2018-10-04 LAB — HEPATITIS B CORE ANTIBODY, TOTAL: Hep B Core Total Ab: NEGATIVE

## 2018-10-05 ENCOUNTER — Telehealth: Payer: Self-pay

## 2018-10-05 NOTE — Telephone Encounter (Signed)
Received a fax from [employer] stating they received the start form.

## 2018-10-05 NOTE — Telephone Encounter (Signed)
Medicaid Berkley Harvey: [id number] (exp. 09/20/18 to 03/19/19) patietn had MRi's on 09/28/18.

## 2018-10-05 NOTE — Telephone Encounter (Signed)
[employer]ecfider[employer] start form has been faxed to [employer]. Fax  # [phone], confirmation received.

## 2018-10-06 NOTE — Telephone Encounter (Signed)
Received fax from [facility] stating Tecfidera starter pack has been shipped to the pt's home and rx is schedule to be delivered on 10/07/18. [facility] SP has been placed in the pt's pharmacy section.

## 2018-10-06 NOTE — Telephone Encounter (Signed)
Received order for from [facility]) pharm for Krista Adkins. Order form has been signed and faxed along with most recent office note to [phone]. Confirmation received.

## 2018-10-07 ENCOUNTER — Telehealth: Payer: Self-pay | Admitting: Neurology

## 2018-10-07 ENCOUNTER — Other Ambulatory Visit: Payer: Medicaid Other

## 2018-10-07 NOTE — Telephone Encounter (Signed)
The JC viral antibody panel is positive with a index value of 2.26.  This patient will not be a candidate for Tysabri in the future.

## 2018-10-12 NOTE — Telephone Encounter (Signed)
Received a fax from [facility] stating Tecfidera prescription has been shipped to the pt. Fax was dated for 10/08/2018.   Dispensing pharmacy  Phone # [phone] Fax # [phone].

## 2018-10-25 ENCOUNTER — Ambulatory Visit: Payer: Medicaid Other | Admitting: Neurology

## 2018-12-17 ENCOUNTER — Other Ambulatory Visit: Payer: Self-pay

## 2018-12-17 ENCOUNTER — Emergency Department: Payer: Medicaid Other

## 2018-12-17 ENCOUNTER — Emergency Department
Admission: EM | Admit: 2018-12-17 | Discharge: 2018-12-17 | Disposition: A | Discharge status: Home / Self Care | Payer: Medicaid Other | Attending: Emergency Medicine | Admitting: Emergency Medicine

## 2018-12-17 DIAGNOSIS — G35 Multiple sclerosis: Secondary | ICD-10-CM | POA: Diagnosis not present

## 2018-12-17 DIAGNOSIS — R079 Chest pain, unspecified: Secondary | ICD-10-CM

## 2018-12-17 DIAGNOSIS — R0789 Other chest pain: Secondary | ICD-10-CM | POA: Diagnosis not present

## 2018-12-17 LAB — CBC
HCT: 35.8 % — ABNORMAL LOW (ref 36.0–46.0)
Hemoglobin: 11.2 g/dL — ABNORMAL LOW (ref 12.0–15.0)
MCH: 27 pg (ref 26.0–34.0)
MCHC: 31.3 g/dL (ref 30.0–36.0)
MCV: 86.3 fL (ref 80.0–100.0)
Platelets: 350 10*3/uL (ref 150–400)
RBC: 4.15 MIL/uL (ref 3.87–5.11)
RDW: 14 % (ref 11.5–15.5)
WBC: 8.6 10*3/uL (ref 4.0–10.5)
nRBC: 0 % (ref 0.0–0.2)

## 2018-12-17 LAB — BASIC METABOLIC PANEL
Anion gap: 9 (ref 5–15)
BUN: 18 mg/dL (ref 6–20)
CO2: 26 mmol/L (ref 22–32)
Calcium: 9.6 mg/dL (ref 8.9–10.3)
Chloride: 102 mmol/L (ref 98–111)
Creatinine, Ser: 0.68 mg/dL (ref 0.44–1.00)
GFR calc Af Amer: >60 mL/min (ref >60)
GFR calc non Af Amer: >60 mL/min (ref >60)
Glucose, Bld: 93 mg/dL (ref 70–99)
Potassium: 3.9 mmol/L (ref 3.5–5.1)
Sodium: 137 mmol/L (ref 135–145)

## 2018-12-17 LAB — TROPONIN I (HIGH SENSITIVITY)
Troponin I (High Sensitivity): <2 ng/L (ref <18)
Troponin I (High Sensitivity): <2 ng/L (ref <18)

## 2018-12-17 NOTE — ED Triage Notes (Signed)
Right sided chest tightness with expiration multiple times per day since pt was approx 23 yo. Reports today pain was more severe and made her feel weak. Has not seen a doctor for it. Pt alert and oriented X4, active, cooperative, pt in NAD. RR even and unlabored, color WNL.

## 2018-12-17 NOTE — Discharge Instructions (Addendum)
Please seek medical attention for any high fevers, chest pain, shortness of breath, change in behavior, persistent vomiting, bloody stool or any other new or concerning symptoms.  

## 2018-12-17 NOTE — ED Provider Notes (Signed)
[facility] Emergency Department Provider Note   ____________________________________________   I have reviewed the triage vital signs and the nursing notes.   HISTORY  Chief Complaint Chest Pain   History limited by: Not Limited   HPI Krista Adkins is a 23 y.o. female who presents to the emergency department today because of concern for chest pain.  Is located in the center part of her chest.  She describes it as a squeezing sensation that takes her breath.  She states that she gets this pain a couple of times a month.  This has been happening for almost 10 years.  She has not seen anyone for this.  Came in today because it lasted longer than normal. She denies any associated nausea or vomiting.   Records reviewed. Per medical record review patient has a history of anemia, MS.  Past Medical History:  Diagnosis Date  . Anemia   . Medical history non-contributory   . Multiple sclerosis (HCC) 09/30/2018  . SVD (spontaneous vaginal delivery) 09/29/2016    Patient Active Problem List   Diagnosis Date Noted  . Multiple sclerosis (HCC) 09/30/2018  . Indication for care in labor or delivery 09/29/2016  . SVD (spontaneous vaginal delivery) 09/29/2016  . Supervision of normal pregnancy, antepartum 03/17/2016    Past Surgical History:  Procedure Laterality Date  . NO PAST SURGERIES      Prior to Admission medications   Medication Sig Start Date End Date Taking? Authorizing Provider  acetaminophen (TYLENOL) 325 MG tablet Take 650 mg by mouth every 6 (six) hours as needed for mild pain or headache.    [provider]  diphenhydrAMINE (BENADRYL) 25 MG tablet Take 25 mg by mouth every 6 (six) hours as needed for allergies.    [provider]  ferrous sulfate 325 (65 FE) MG tablet Take 325 mg by mouth every other day.    [provider]  gabapentin (NEURONTIN) 300 MG capsule Take 1 capsule (300 mg total) by mouth 3 (three) times  daily. 08/06/18   Levert Feinstein, MD  ibuprofen (ADVIL,MOTRIN) 600 MG tablet Take 1 tablet (600 mg total) by mouth every 6 (six) hours. 10/01/16   Huel Cote, MD  predniSONE (DELTASONE) 10 MG tablet Begin taking 6 tablets daily, taper by one tablet every other day until off the medication. 09/28/18   York Spaniel, MD    Allergies Patient has no known allergies.  No family history on file.  Social History Social History   Tobacco Use  . Smoking status: Never Smoker  . Smokeless tobacco: Never Used  Substance Use Topics  . Alcohol use: No  . Drug use: No    Review of Systems Constitutional: No fever/chills Eyes: No visual changes. ENT: No sore throat. Cardiovascular: Positive chest pain. Respiratory: Positive shortness of breath. Gastrointestinal: No abdominal pain.  No nausea, no vomiting.  No diarrhea.   Genitourinary: Negative for dysuria. Musculoskeletal: Negative for back pain. Skin: Negative for rash. Neurological: Negative for headaches, focal weakness or numbness.  ____________________________________________   PHYSICAL EXAM:  VITAL SIGNS: ED Triage Vitals [12/17/18 1645]  Enc Vitals Group     BP 103/72     Pulse Rate 83     Resp 18     Temp 99.2 F (37.3 C)     Temp Source Oral     SpO2 100 %     Weight 165 lb (74.8 kg)     Height 5\' 7"  (1.702 m)  Head Circumference      Peak Flow      Pain Score 7   Constitutional: Alert and oriented.  Eyes: Conjunctivae are normal.  ENT      Head: Normocephalic and atraumatic.      Nose: No congestion/rhinnorhea.      Mouth/Throat: Mucous membranes are moist.      Neck: No stridor. Hematological/Lymphatic/Immunilogical: No cervical lymphadenopathy. Cardiovascular: Normal rate, regular rhythm.  No murmurs, rubs, or gallops.  Respiratory: Normal respiratory effort without tachypnea nor retractions. Breath sounds are clear and equal bilaterally. No wheezes/rales/rhonchi. Gastrointestinal: Soft and non  tender. No rebound. No guarding.  Genitourinary: Deferred Musculoskeletal: Normal range of motion in all extremities.  Neurologic:  Normal speech and language. No gross focal neurologic deficits are appreciated.  Skin:  Skin is warm, dry and intact. No rash noted. Psychiatric: Mood and affect are normal. Speech and behavior are normal. Patient exhibits appropriate insight and judgment.  ____________________________________________    LABS (pertinent positives/negatives)  Trop hs <2 x 2 BMP wnl CBC wbc 8.6, hgb 11.2, plt 350  ____________________________________________   EKG  I, Nance Pear, attending physician, personally viewed and interpreted this EKG  EKG Time: 1652 Rate: 78 Rhythm: normal sinus rhythm Axis: normal Intervals: qtc 410 QRS: narrow ST changes: no st elevation Impression: normal ekg ____________________________________________    RADIOLOGY  CXR No acute abnormality  ____________________________________________   PROCEDURES  Procedures  ____________________________________________   INITIAL IMPRESSION / ASSESSMENT AND PLAN / ED COURSE  Pertinent labs & imaging results that were available during my care of the patient were reviewed by me and considered in my medical decision making (see chart for details).   Patient presented to the emergency department today because of chest pain. Work up with troponin negative x 2. CXR negative. Discussed this with patient. Do wonder if panic attacks or esophageal spasms creating the pain. Discussed with patient follow up.  ____________________________________________   FINAL CLINICAL IMPRESSION(S) / ED DIAGNOSES  Final diagnoses:  Nonspecific chest pain     Note: This dictation was prepared with Dragon dictation. Any transcriptional errors that result from this process are unintentional     Nance Pear, MD 12/17/18 2020

## 2018-12-17 NOTE — ED Notes (Signed)
Patient states having right sided chest pain that felt like a squeeze that lasted 30 min or so before easing off. Patient states she has felt this before but today is first time being seen for symptoms.

## 2019-01-30 NOTE — Progress Notes (Deleted)
PATIENT: Krista Adkins DOB: 07/25/1995  REASON FOR VISIT: follow up HISTORY FROM: patient  HISTORY OF PRESENT ILLNESS: Today 01/30/19  Krista Adkins is a 23 year old female with history of multiple sclerosis.  She presented with history of paresthesias in 2018.  She had a recent MS exacerbation in May 2020 was treated with a prednisone Dosepak.  She remains on Tecfidera.  Her JC viral antibody is positive with index value of 2.26.  HISTORY 09/30/2018 Dr. Jannifer Franklin: Krista Adkins is a 23 year old right-handed black female with a history of paresthesias involving the left leg and left arm that began initially in 2018.  The patient has been set up for MRI of the brain, cervical spine, and thoracic spine which revealed evidence of a C5-6 spinal cord enhancing lesion and evidence of bilateral periventricular white matter lesions consistent with a diagnosis of multiple sclerosis.  The patient has just recently had exacerbation of her left-sided numbness, she now has significant numbness of the left neck and left arm with a sensory ataxia of the left arm, and some weakness of the left hand.  This began within the last week, the patient is now on prednisone.  She comes in for discussion regarding medical therapy for multiple sclerosis.  She reports no vision changes, or changes in control the bowels or the bladder.  REVIEW OF SYSTEMS: Out of a complete 14 system review of symptoms, the patient complains only of the following symptoms, and all other reviewed systems are negative.  ALLERGIES: No Known Allergies  HOME MEDICATIONS: Outpatient Medications Prior to Visit  Medication Sig Dispense Refill  . acetaminophen (TYLENOL) 325 MG tablet Take 650 mg by mouth every 6 (six) hours as needed for mild pain or headache.    . diphenhydrAMINE (BENADRYL) 25 MG tablet Take 25 mg by mouth every 6 (six) hours as needed for allergies.    . ferrous sulfate 325 (65 FE) MG tablet Take 325 mg by mouth every other day.    .  gabapentin (NEURONTIN) 300 MG capsule Take 1 capsule (300 mg total) by mouth 3 (three) times daily. 90 capsule 5  . ibuprofen (ADVIL,MOTRIN) 600 MG tablet Take 1 tablet (600 mg total) by mouth every 6 (six) hours. 30 tablet 0  . predniSONE (DELTASONE) 10 MG tablet Begin taking 6 tablets daily, taper by one tablet every other day until off the medication. 42 tablet 0   No facility-administered medications prior to visit.     PAST MEDICAL HISTORY: Past Medical History:  Diagnosis Date  . Anemia   . Medical history non-contributory   . Multiple sclerosis ([place]) 09/30/2018  . SVD (spontaneous vaginal delivery) 09/29/2016    PAST SURGICAL HISTORY: Past Surgical History:  Procedure Laterality Date  . NO PAST SURGERIES      FAMILY HISTORY: No family history on file.  SOCIAL HISTORY: Social History   Socioeconomic History  . Marital status: Single    Spouse name: Not on file  . Number of children: Not on file  . Years of education: Not on file  . Highest education level: Not on file  Occupational History  . Not on file  Social Needs  . Financial resource strain: Not on file  . Food insecurity    Worry: Not on file    Inability: Not on file  . Transportation needs    Medical: Not on file    Non-medical: Not on file  Tobacco Use  . Smoking status: Never Smoker  . Smokeless tobacco:  Never Used  Substance and Sexual Activity  . Alcohol use: No  . Drug use: No  . Sexual activity: Not on file  Lifestyle  . Physical activity    Days per week: Not on file    Minutes per session: Not on file  . Stress: Not on file  Relationships  . Social Musician on phone: Not on file    Gets together: Not on file    Attends religious service: Not on file    Active member of club or organization: Not on file    Attends meetings of clubs or organizations: Not on file    Relationship status: Not on file  . Intimate partner violence    Fear of current or ex partner: Not on file     Emotionally abused: Not on file    Physically abused: Not on file    Forced sexual activity: Not on file  Other Topics Concern  . Not on file  Social History Narrative  . Not on file      PHYSICAL EXAM  There were no vitals filed for this visit. There is no height or weight on file to calculate BMI.  Generalized: Well developed, in no acute distress   Neurological examination  Mentation: Alert oriented to time, place, history taking. Follows all commands speech and language fluent Cranial nerve II-XII: Pupils were equal round reactive to light. Extraocular movements were full, visual field were full on confrontational test. Facial sensation and strength were normal. Uvula tongue midline. Head turning and shoulder shrug  were normal and symmetric. Motor: The motor testing reveals 5 over 5 strength of all 4 extremities. Good symmetric motor tone is noted throughout.  Sensory: Sensory testing is intact to soft touch on all 4 extremities. No evidence of extinction is noted.  Coordination: Cerebellar testing reveals good finger-nose-finger and heel-to-shin bilaterally.  Gait and station: Gait is normal. Tandem gait is normal. Romberg is negative. No drift is seen.  Reflexes: Deep tendon reflexes are symmetric and normal bilaterally.   DIAGNOSTIC DATA (LABS, IMAGING, TESTING) - I reviewed patient records, labs, notes, testing and imaging myself where available.  Lab Results  Component Value Date   WBC 8.6 12/17/2018   HGB 11.2 (L) 12/17/2018   HCT 35.8 (L) 12/17/2018   MCV 86.3 12/17/2018   PLT 350 12/17/2018      Component Value Date/Time   NA 137 12/17/2018 1651   NA 141 09/30/2018 0908   K 3.9 12/17/2018 1651   CL 102 12/17/2018 1651   CO2 26 12/17/2018 1651   GLUCOSE 93 12/17/2018 1651   BUN 18 12/17/2018 1651   BUN 16 09/30/2018 0908   CREATININE 0.68 12/17/2018 1651   CALCIUM 9.6 12/17/2018 1651   PROT 7.9 09/30/2018 0908   ALBUMIN 4.9 09/30/2018 0908   AST 21  09/30/2018 0908   ALT 14 09/30/2018 0908   ALKPHOS 50 09/30/2018 0908   BILITOT 0.3 09/30/2018 0908   GFRNONAA >60 12/17/2018 1651   GFRAA >60 12/17/2018 1651   No results found for: CHOL, HDL, LDLCALC, LDLDIRECT, TRIG, CHOLHDL No results found for: [id number] Lab Results  Component Value Date   VITAMINB12 617 07/23/2018   No results found for: TSH    ASSESSMENT AND PLAN 23 y.o. year old female  has a past medical history of Anemia, Medical history non-contributory, Multiple sclerosis (HCC) (09/30/2018), and SVD (spontaneous vaginal delivery) (09/29/2016). here with ***   I spent 15 minutes with the  patient. 50% of this time was spent   Margie Ege, [place], DNP 01/30/2019, 3:13 PM [facility] [address] [place], [place] [address] [phone]

## 2019-01-31 ENCOUNTER — Encounter: Payer: Self-pay | Admitting: Neurology

## 2019-01-31 ENCOUNTER — Ambulatory Visit: Payer: Medicaid Other | Admitting: Neurology

## 2019-01-31 NOTE — Progress Notes (Signed)
PATIENT: Krista Adkins DOB: Nov 05, 1995  REASON FOR VISIT: follow up HISTORY FROM: patient  HISTORY OF PRESENT ILLNESS: Today 02/01/19  [name] is a 23 year old female with history of multiple sclerosis.  She presented with history of paresthesias in 2018.  She had a recent MS exacerbation in May 2020 was treated with a prednisone Dosepak.  She remains on Tecfidera.  Her JC viral antibody is positive with index value of 2.26.  She has been taking Tecfidera since June 2020 and tolerating well.  While on the medication, she denies any numbness or weakness.  She continues to complain of aching in her hands on a daily basis.  She will have an occasional sharp pain in her back or legs. She denies any changes to her vision, bowels, or bladder.  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  She lives with her parents.  She is no longer taking gabapentin.  MRI of the brain in May 2020 showed at least 2 periventricular white matter lesions very consistent with multiple sclerosis, she likely has a mildly enhancing cervical cord lesion as well.  Her thoracic spinal cord appears to be unremarkable. She presents today for follow-up unaccompanied.  HISTORY 09/30/2018 Dr. Anne Hahn: Ms. Banken is a 23 year old right-handed black female with a history of paresthesias involving the left leg and left arm that began initially in 2018.  The patient has been set up for MRI of the brain, cervical spine, and thoracic spine which revealed evidence of a C5-6 spinal cord enhancing lesion and evidence of bilateral periventricular white matter lesions consistent with a diagnosis of multiple sclerosis.  The patient has just recently had exacerbation of her left-sided numbness, she now has significant numbness of the left neck and left arm with a sensory ataxia of the left arm, and some weakness of the left hand.  This began within the last week, the patient is now on prednisone.  She comes in for discussion regarding medical  therapy for multiple sclerosis.  She reports no vision changes, or changes in control the bowels or the bladder.  REVIEW OF SYSTEMS: Out of a complete 14 system review of symptoms, the patient complains only of the following symptoms, and all other reviewed systems are negative.  Muscle aches  ALLERGIES: No Known Allergies  HOME MEDICATIONS: Outpatient Medications Prior to Visit  Medication Sig Dispense Refill   acetaminophen (TYLENOL) 325 MG tablet Take 650 mg by mouth every 6 (six) hours as needed for mild pain or headache.     diphenhydrAMINE (BENADRYL) 25 MG tablet Take 25 mg by mouth every 6 (six) hours as needed for allergies.     ferrous sulfate 325 (65 FE) MG tablet Take 325 mg by mouth every other day.     gabapentin (NEURONTIN) 300 MG capsule Take 1 capsule (300 mg total) by mouth 3 (three) times daily. 90 capsule 5   ibuprofen (ADVIL,MOTRIN) 600 MG tablet Take 1 tablet (600 mg total) by mouth every 6 (six) hours. 30 tablet 0   predniSONE (DELTASONE) 10 MG tablet Begin taking 6 tablets daily, taper by one tablet every other day until off the medication. 42 tablet 0   No facility-administered medications prior to visit.     PAST MEDICAL HISTORY: Past Medical History:  Diagnosis Date   Anemia    Medical history non-contributory    Multiple sclerosis (HCC) 09/30/2018   SVD (spontaneous vaginal delivery) 09/29/2016    PAST SURGICAL HISTORY: Past Surgical History:  Procedure  Laterality Date   NO PAST SURGERIES      FAMILY HISTORY: History reviewed. No pertinent family history.  SOCIAL HISTORY: Social History   Socioeconomic History   Marital status: Single    Spouse name: Not on file   Number of children: Not on file   Years of education: Not on file   Highest education level: Not on file  Occupational History   Not on file  Social Needs   Financial resource strain: Not on file   Food insecurity    Worry: Not on file    Inability: Not on  file   Transportation needs    Medical: Not on file    Non-medical: Not on file  Tobacco Use   Smoking status: Never Smoker   Smokeless tobacco: Never Used  Substance and Sexual Activity   Alcohol use: No   Drug use: No   Sexual activity: Not on file  Lifestyle   Physical activity    Days per week: Not on file    Minutes per session: Not on file   Stress: Not on file  Relationships   Social connections    Talks on phone: Not on file    Gets together: Not on file    Attends religious service: Not on file    Active member of club or organization: Not on file    Attends meetings of clubs or organizations: Not on file    Relationship status: Not on file   Intimate partner violence    Fear of current or ex partner: Not on file    Emotionally abused: Not on file    Physically abused: Not on file    Forced sexual activity: Not on file  Other Topics Concern   Not on file  Social History Narrative   Not on file      PHYSICAL EXAM  Vitals:   02/01/19 1317  BP: 114/73  Pulse: 82  Temp: (!) 97.1 F (36.2 C)  Weight: 167 lb (75.8 kg)   Body mass index is 26.16 kg/m.  Generalized: Well developed, in no acute distress   Neurological examination  Mentation: Alert oriented to time, place, history taking. Follows all commands speech and language fluent Cranial nerve II-XII: Pupils were equal round reactive to light. Extraocular movements were full, visual field were full on confrontational test. Facial sensation and strength were normal.  Head turning and shoulder shrug were normal and symmetric. Motor: The motor testing reveals 5 over 5 strength of all 4 extremities. Good symmetric motor tone is noted throughout.  Sensory: Sensory testing is intact to soft touch on all 4 extremities. No evidence of extinction is noted.  Coordination: Cerebellar testing reveals good finger-nose-finger and heel-to-shin bilaterally.  Gait and station: Gait is normal. Tandem gait is  normal. Romberg is negative. No drift is seen.  Reflexes: Deep tendon reflexes are symmetric and normal bilaterally.   DIAGNOSTIC DATA (LABS, IMAGING, TESTING) - I reviewed patient records, labs, notes, testing and imaging myself where available.  Lab Results  Component Value Date   WBC 8.6 12/17/2018   HGB 11.2 (L) 12/17/2018   HCT 35.8 (L) 12/17/2018   MCV 86.3 12/17/2018   PLT 350 12/17/2018      Component Value Date/Time   NA 137 12/17/2018 1651   NA 141 09/30/2018 0908   K 3.9 12/17/2018 1651   CL 102 12/17/2018 1651   CO2 26 12/17/2018 1651   GLUCOSE 93 12/17/2018 1651   BUN 18 12/17/2018 1651  BUN 16 09/30/2018 0908   CREATININE 0.68 12/17/2018 1651   CALCIUM 9.6 12/17/2018 1651   PROT 7.9 09/30/2018 0908   ALBUMIN 4.9 09/30/2018 0908   AST 21 09/30/2018 0908   ALT 14 09/30/2018 0908   ALKPHOS 50 09/30/2018 0908   BILITOT 0.3 09/30/2018 0908   GFRNONAA >60 12/17/2018 1651   GFRAA >60 12/17/2018 1651   No results found for: CHOL, HDL, LDLCALC, LDLDIRECT, TRIG, CHOLHDL No results found for: IHKV4Q Lab Results  Component Value Date   VITAMINB12 617 07/23/2018   No results found for: TSH  MRI of the brain 09/28/2018 IMPRESSION:  Abnormal MRI brain (with and without) demonstrating: -T2 hyperintense lesions in the right centrum semiovale and left periatrial regions, which in combination with cervical spinal cord lesion from prior study are suspicious for chronic demyelinating disease. -No abnormal enhancement noted.  MRI of thoracic spine 09/23/2018 IMPRESSION: Unremarkable MRI scan of the thoracic spine with and without contrast.  MRI of cervical spine 09/24/2018 IMPRESSION: Abnormal MRI scan of the cervical spine showing spinal cord hyperintensity at C5-C6 showing faint enhancement dorsally most likely compatible with a active demyelinating plaque.  ASSESSMENT AND PLAN 23 y.o. year old female  has a past medical history of Anemia, Medical history  non-contributory, Multiple sclerosis (HCC) (09/30/2018), and SVD (spontaneous vaginal delivery) (09/29/2016). here with:  1.  Multiple sclerosis  She has continued to do well taking Tecfidera.  She has been on the medication since June 2020.  She reports since taking Tecfidera her numbness has resolved.  She does complain of achy pain in both of her hands.  She was taking gabapentin, but was not taking it consistently and did not find it to be beneficial.  She can stop gabapentin.  She can start Cymbalta 30 mg daily for her achy pain.  I will check routine lab work while taking Tecfidera.  She will follow-up in 6 months or sooner if needed.  I did advise if her symptoms worsen or she develops any new symptoms she should let us know.  Her JCV viral antibody panel was positive with a value of 2.26, indicating she is not a candidate for Tysabri in the future. At next visit, we may consider a repeat MRI of the brain and cervical spine to ensure her lesions are not progressing.   I spent 25 minutes with the patient. 50% of this time was spent discussing her plan of care.   Margie Ege, AGNP-C, DNP 02/01/2019, 1:33 PM [facility] [address], [address] [place], [place] [address] [phone]

## 2019-02-01 ENCOUNTER — Other Ambulatory Visit: Payer: Self-pay

## 2019-02-01 ENCOUNTER — Encounter: Payer: Self-pay | Admitting: Neurology

## 2019-02-01 ENCOUNTER — Ambulatory Visit: Payer: Medicaid Other | Admitting: Neurology

## 2019-02-01 VITALS — BP 114/73 | HR 82 | Temp 97.1°F | Wt 167.0 lb

## 2019-02-01 DIAGNOSIS — G35 Multiple sclerosis: Secondary | ICD-10-CM | POA: Diagnosis not present

## 2019-02-01 MED ORDER — DULOXETINE HCL 30 MG PO CPEP: 30.0000 mg | ORAL_CAPSULE | Freq: Every day | ORAL | 3 refills | Status: DC

## 2019-02-01 NOTE — Patient Instructions (Signed)
1. Continue Tecfidera 2. Start Cymbalta 30 mg daily for achy daily pain 3. I will check lab work today  4. Follow-up in 6 months

## 2019-02-01 NOTE — Progress Notes (Signed)
I have read the note, and I agree with the clinical assessment and plan.  Gayl Ivanoff K Ellana Kawa   

## 2019-02-02 ENCOUNTER — Telehealth: Payer: Self-pay

## 2019-02-02 LAB — COMPREHENSIVE METABOLIC PANEL
ALT: 15 IU/L (ref 0–32)
AST: 20 IU/L (ref 0–40)
Albumin/Globulin Ratio: 1.8 (ref 1.2–2.2)
Albumin: 4.8 g/dL (ref 3.9–5.0)
Alkaline Phosphatase: 54 IU/L (ref 39–117)
BUN/Creatinine Ratio: 25 — ABNORMAL HIGH (ref 9–23)
BUN: 17 mg/dL (ref 6–20)
Bilirubin Total: 0.3 mg/dL (ref 0.0–1.2)
CO2: 23 mmol/L (ref 20–29)
Calcium: 9.6 mg/dL (ref 8.7–10.2)
Chloride: 98 mmol/L (ref 96–106)
Creatinine, Ser: 0.69 mg/dL (ref 0.57–1.00)
GFR calc Af Amer: 144 mL/min/{1.73_m2} (ref >59)
GFR calc non Af Amer: 125 mL/min/{1.73_m2} (ref >59)
Globulin, Total: 2.6 g/dL (ref 1.5–4.5)
Glucose: 70 mg/dL (ref 65–99)
Potassium: 4.4 mmol/L (ref 3.5–5.2)
Sodium: 135 mmol/L (ref 134–144)
Total Protein: 7.4 g/dL (ref 6.0–8.5)

## 2019-02-02 LAB — CBC WITH DIFFERENTIAL/PLATELET
Basophils Absolute: 0 10*3/uL (ref 0.0–0.2)
Basos: 0 %
EOS (ABSOLUTE): 0.1 10*3/uL (ref 0.0–0.4)
Eos: 1 %
Hematocrit: 38 % (ref 34.0–46.6)
Hemoglobin: 12 g/dL (ref 11.1–15.9)
Immature Grans (Abs): 0 10*3/uL (ref 0.0–0.1)
Immature Granulocytes: 0 %
Lymphocytes Absolute: 1.6 10*3/uL (ref 0.7–3.1)
Lymphs: 24 %
MCH: 27.1 pg (ref 26.6–33.0)
MCHC: 31.6 g/dL (ref 31.5–35.7)
MCV: 86 fL (ref 79–97)
Monocytes Absolute: 0.6 10*3/uL (ref 0.1–0.9)
Monocytes: 9 %
Neutrophils Absolute: 4.5 10*3/uL (ref 1.4–7.0)
Neutrophils: 66 %
Platelets: 293 10*3/uL (ref 150–450)
RBC: 4.43 x10E6/uL (ref 3.77–5.28)
RDW: 13.3 % (ref 11.7–15.4)
WBC: 6.9 10*3/uL (ref 3.4–10.8)

## 2019-02-02 NOTE — Telephone Encounter (Signed)
Spoke with the patient and she verbalized understanding her results. No questions or concerns at this time.   

## 2019-02-02 NOTE — Telephone Encounter (Signed)
-----   Message from Brandon Melnick, RN sent at 02/02/2019  9:59 AM EDT -----  ----- Message ----- From: Suzzanne Cloud, NP Sent: 02/02/2019   8:00 AM EDT To: Brandon Melnick, RN  Please call the patient. Lab work looks good while on [employer].

## 2019-04-05 ENCOUNTER — Other Ambulatory Visit: Payer: Self-pay

## 2019-04-05 DIAGNOSIS — Z20822 Contact with and (suspected) exposure to covid-19: Secondary | ICD-10-CM

## 2019-04-07 LAB — NOVEL CORONAVIRUS, NAA: SARS-CoV-2, NAA: NOT DETECTED

## 2019-04-24 ENCOUNTER — Emergency Department
Admission: EM | Admit: 2019-04-24 | Discharge: 2019-04-24 | Disposition: A | Discharge status: Home / Self Care | Payer: Medicaid Other | Attending: Emergency Medicine | Admitting: Emergency Medicine

## 2019-04-24 ENCOUNTER — Encounter: Payer: Self-pay | Admitting: Emergency Medicine

## 2019-04-24 ENCOUNTER — Other Ambulatory Visit: Payer: Self-pay

## 2019-04-24 DIAGNOSIS — H1033 Unspecified acute conjunctivitis, bilateral: Secondary | ICD-10-CM | POA: Diagnosis not present

## 2019-04-24 DIAGNOSIS — Z79899 Other long term (current) drug therapy: Secondary | ICD-10-CM | POA: Insufficient documentation

## 2019-04-24 DIAGNOSIS — H5713 Ocular pain, bilateral: Secondary | ICD-10-CM | POA: Diagnosis present

## 2019-04-24 MED ORDER — TOBRAMYCIN 0.3 % OP SOLN: 2.0000 [drp] | OPHTHALMIC | 0 refills | Status: DC

## 2019-04-24 NOTE — Discharge Instructions (Addendum)
Follow-up with your regular doctor if not better in 3 days.  Return emergency department worsening.  Use medication as prescribed. 

## 2019-04-24 NOTE — ED Provider Notes (Signed)
[facility] Emergency Department Provider Note  ____________________________________________   First MD Initiated Contact with Patient 04/24/19 1109     (approximate)  I have reviewed the triage vital signs and the nursing notes.   HISTORY  Chief Complaint Eye Drainage and Conjunctivitis    HPI Krista Adkins is a 23 y.o. female presents emergency department complaining of right eye drainage, burning and crusting.  States she does wear contacts but took them out yesterday.  Pain did not start until much later in the day.  No fever or chills.  No change in vision.    Past Medical History:  Diagnosis Date  . Anemia   . Medical history non-contributory   . Multiple sclerosis ([place]) 09/30/2018  . SVD (spontaneous vaginal delivery) 09/29/2016    Patient Active Problem List   Diagnosis Date Noted  . Multiple sclerosis ([place]) 09/30/2018  . Indication for care in labor or delivery 09/29/2016  . SVD (spontaneous vaginal delivery) 09/29/2016  . Supervision of normal pregnancy, antepartum 03/17/2016    Past Surgical History:  Procedure Laterality Date  . NO PAST SURGERIES      Prior to Admission medications   Medication Sig Start Date End Date Taking? Authorizing Provider  acetaminophen (TYLENOL) 325 MG tablet Take 650 mg by mouth every 6 (six) hours as needed for mild pain or headache.    [provider]  Dimethyl Fumarate (TECFIDERA) 240 MG CPDR Take 240 mg by mouth 2 (two) times daily.    [provider]  diphenhydrAMINE (BENADRYL) 25 MG tablet Take 25 mg by mouth every 6 (six) hours as needed for allergies.    [provider]  DULoxetine (CYMBALTA) 30 MG capsule Take 1 capsule (30 mg total) by mouth daily. 02/01/19   Suzzanne Cloud, NP  ferrous sulfate 325 (65 FE) MG tablet Take 325 mg by mouth every other day.    [provider]  ibuprofen (ADVIL,MOTRIN) 600 MG tablet Take 1 tablet (600 mg total) by mouth every 6  (six) hours. 10/01/16   Paula Compton, MD  predniSONE (DELTASONE) 10 MG tablet Begin taking 6 tablets daily, taper by one tablet every other day until off the medication. 09/28/18   Kathrynn Ducking, MD  tobramycin (TOBREX) 0.3 % ophthalmic solution Place 2 drops into both eyes every 4 (four) hours. 04/24/19   Versie Starks, PA-C    Allergies Patient has no known allergies.  No family history on file.  Social History Social History   Tobacco Use  . Smoking status: Never Smoker  . Smokeless tobacco: Never Used  Substance Use Topics  . Alcohol use: No  . Drug use: No    Review of Systems  Constitutional: No fever/chills Eyes: No visual changes.  Positive for right eye redness and drainage ENT: No sore throat. Respiratory: Denies cough Genitourinary: Negative for dysuria. Musculoskeletal: Negative for back pain. Skin: Negative for rash.    ____________________________________________   PHYSICAL EXAM:  VITAL SIGNS: ED Triage Vitals  Enc Vitals Group     BP 04/24/19 0946 126/78     Pulse Rate 04/24/19 0946 85     Resp 04/24/19 0946 16     Temp 04/24/19 0946 99.5 F (37.5 C)     Temp Source 04/24/19 0946 Oral     SpO2 04/24/19 0946 97 %     Weight 04/24/19 0944 150 lb (68 kg)     Height 04/24/19 0944 5\' 7"  (1.702 m)  Head Circumference --      Peak Flow --      Pain Score 04/24/19 0944 8     Pain Loc --      Pain Edu? --      Excl. in GC? --     Constitutional: Alert and oriented. Well appearing and in no acute distress. Eyes: Conjunctiva of the right eye is injected, eyes sensitive to light, crusting noted on the lashes Head: Atraumatic. Nose: No congestion/rhinnorhea. Mouth/Throat: Mucous membranes are moist.   Neck:  supple no lymphadenopathy noted Cardiovascular: Normal rate, regular rhythm. Heart sounds are normal Respiratory: Normal respiratory effort.  No retractions, lungs c t a  GU: deferred Musculoskeletal: FROM all extremities, warm and  well perfused Neurologic:  Normal speech and language.  Skin:  Skin is warm, dry and intact. No rash noted. Psychiatric: Mood and affect are normal. Speech and behavior are normal.  ____________________________________________   LABS (all labs ordered are listed, but only abnormal results are displayed)  Labs Reviewed - No data to display ____________________________________________   ____________________________________________  RADIOLOGY    ____________________________________________   PROCEDURES  Procedure(s) performed: No  Procedures    ____________________________________________   INITIAL IMPRESSION / ASSESSMENT AND PLAN / ED COURSE  Pertinent labs & imaging results that were available during my care of the patient were reviewed by me and considered in my medical decision making (see chart for details).   Patient is 23 year old female presents emergency department with concerns of conjunctivitis.  See HPI  Physical exam shows patient to appear well.  Conjunctiva of the right eye is injected along with some tearing and matted noted.  Explained findings to the patient.  She was informed just conjunctivitis and should wash her hands frequently.  Use the eyedrops as prescribed.  Follow-up with eye doctor if not better in 3 days.  Return emergency department worsening.  Patient understands and was discharged stable condition.    Krista Adkins was evaluated in Emergency Department on 04/24/2019 for the symptoms described in the history of present illness. She was evaluated in the context of the global COVID-19 pandemic, which necessitated consideration that the patient might be at risk for infection with the SARS-CoV-2 virus that causes COVID-19. Institutional protocols and algorithms that pertain to the evaluation of patients at risk for COVID-19 are in a state of rapid change based on information released by regulatory bodies including the CDC and federal and state  organizations. These policies and algorithms were followed during the patient's care in the ED.   As part of my medical decision making, I reviewed the following data within the electronic MEDICAL RECORD NUMBER Nursing notes reviewed and incorporated, Old chart reviewed, Notes from prior ED visits and [place] Controlled Substance Database  ____________________________________________   FINAL CLINICAL IMPRESSION(S) / ED DIAGNOSES  Final diagnoses:  Acute bacterial conjunctivitis of both eyes      NEW MEDICATIONS STARTED DURING THIS VISIT:  New Prescriptions   TOBRAMYCIN (TOBREX) 0.3 % OPHTHALMIC SOLUTION    Place 2 drops into both eyes every 4 (four) hours.     Note:  This document was prepared using Dragon voice recognition software and may include unintentional dictation errors.    Faythe Ghee, PA-C 04/24/19 1300    Jene Every, MD 04/24/19 1321

## 2019-04-24 NOTE — ED Triage Notes (Signed)
Pt reports last pm her right eye started draining and itching and this am it was crusted shut.

## 2019-07-17 ENCOUNTER — Emergency Department (HOSPITAL_COMMUNITY)
Admission: EM | Admit: 2019-07-17 | Discharge: 2019-07-17 | Disposition: A | Discharge status: Home / Self Care | Payer: Medicaid Other | Attending: Emergency Medicine | Admitting: Emergency Medicine

## 2019-07-17 ENCOUNTER — Encounter (HOSPITAL_COMMUNITY): Payer: Self-pay

## 2019-07-17 ENCOUNTER — Other Ambulatory Visit: Payer: Self-pay

## 2019-07-17 DIAGNOSIS — G35 Multiple sclerosis: Secondary | ICD-10-CM | POA: Diagnosis not present

## 2019-07-17 DIAGNOSIS — R21 Rash and other nonspecific skin eruption: Secondary | ICD-10-CM | POA: Diagnosis present

## 2019-07-17 DIAGNOSIS — L859 Epidermal thickening, unspecified: Secondary | ICD-10-CM | POA: Insufficient documentation

## 2019-07-17 DIAGNOSIS — L858 Other specified epidermal thickening: Secondary | ICD-10-CM

## 2019-07-17 MED ORDER — LORATADINE 10 MG PO TABS
10.0000 mg | ORAL_TABLET | Freq: Once | ORAL | Status: AC
  Administered 2019-07-17: 10 mg via ORAL
  Filled 2019-07-17: qty 1

## 2019-07-17 NOTE — ED Triage Notes (Signed)
Pt states that she has developed a generalized rash that started at apprx 2230. Pt states that she took her normal medications tonight, but does not have any known allergies, or past reactions. Denies any throat swelling or difficulty breathing.

## 2019-07-17 NOTE — ED Provider Notes (Signed)
[facility] DEPT Provider Note   CSN: [id number] Arrival date & time: 07/17/19  0040     History Chief Complaint  Patient presents with  . Rash    Krista Adkins is a 24 y.o. female.  The history is provided by the patient.  Rash Location: upper chest. Quality: itchiness   Severity:  Mild Onset quality:  Gradual Timing:  Constant Progression:  Unchanged Chronicity:  New Context: not animal contact, not chemical exposure, not diapers, not eggs, not exposure to similar rash, not food, not hot tub use, not insect bite/sting, not medications, not new detergent/soap and not nuts   Relieved by:  Nothing Worsened by:  Nothing Ineffective treatments:  None tried Associated symptoms: no fever, no joint pain, no nausea, no shortness of breath, no throat swelling, no tongue swelling, no URI, not vomiting and not wheezing        Past Medical History:  Diagnosis Date  . Anemia   . Medical history non-contributory   . Multiple sclerosis ([place]) 09/30/2018  . SVD (spontaneous vaginal delivery) 09/29/2016    Patient Active Problem List   Diagnosis Date Noted  . Multiple sclerosis ([place]) 09/30/2018  . Indication for care in labor or delivery 09/29/2016  . SVD (spontaneous vaginal delivery) 09/29/2016  . Supervision of normal pregnancy, antepartum 03/17/2016    Past Surgical History:  Procedure Laterality Date  . NO PAST SURGERIES       OB History    Gravida  1   Para  1   Term  1   Preterm      AB      Living  1     SAB      TAB      Ectopic      Multiple  0   Live Births  1           History reviewed. No pertinent family history.  Social History   Tobacco Use  . Smoking status: Never Smoker  . Smokeless tobacco: Never Used  Substance Use Topics  . Alcohol use: No  . Drug use: No    Home Medications Prior to Admission medications   Medication Sig Start Date End Date Taking? Authorizing Provider  acetaminophen  (TYLENOL) 325 MG tablet Take 650 mg by mouth every 6 (six) hours as needed for mild pain or headache.    [provider]  Dimethyl Fumarate (TECFIDERA) 240 MG CPDR Take 240 mg by mouth 2 (two) times daily.    [provider]  diphenhydrAMINE (BENADRYL) 25 MG tablet Take 25 mg by mouth every 6 (six) hours as needed for allergies.    [provider]  DULoxetine (CYMBALTA) 30 MG capsule Take 1 capsule (30 mg total) by mouth daily. 02/01/19   Suzzanne Cloud, NP  ferrous sulfate 325 (65 FE) MG tablet Take 325 mg by mouth every other day.    [provider]  ibuprofen (ADVIL,MOTRIN) 600 MG tablet Take 1 tablet (600 mg total) by mouth every 6 (six) hours. 10/01/16   Paula Compton, MD  predniSONE (DELTASONE) 10 MG tablet Begin taking 6 tablets daily, taper by one tablet every other day until off the medication. 09/28/18   Kathrynn Ducking, MD  tobramycin (TOBREX) 0.3 % ophthalmic solution Place 2 drops into both eyes every 4 (four) hours. 04/24/19   Versie Starks, PA-C    Allergies    Patient has no known allergies.  Review of Systems  Review of Systems  Constitutional: Negative for fever.  HENT: Negative for congestion.   Eyes: Negative for visual disturbance.  Respiratory: Negative for cough, shortness of breath and wheezing.   Cardiovascular: Negative for chest pain.  Gastrointestinal: Negative for nausea and vomiting.  Genitourinary: Negative for difficulty urinating.  Musculoskeletal: Negative for arthralgias.  Skin: Positive for rash.  Neurological: Negative for dizziness.  All other systems reviewed and are negative.   Physical Exam Updated Vital Signs BP (!) 97/58   Pulse 78   Temp 98.8 F (37.1 C) (Oral)   Resp 16   Ht 5\' 7"  (1.702 m)   Wt 74.8 kg   SpO2 99%   BMI 25.84 kg/m   Physical Exam Vitals and nursing note reviewed.  Constitutional:      General: She is not in acute distress.    Appearance: Normal appearance.  HENT:      Head: Normocephalic and atraumatic.     Nose: Nose normal.  Eyes:     Conjunctiva/sclera: Conjunctivae normal.     Pupils: Pupils are equal, round, and reactive to light.  Cardiovascular:     Rate and Rhythm: Normal rate and regular rhythm.     Pulses: Normal pulses.     Heart sounds: Normal heart sounds.  Pulmonary:     Effort: Pulmonary effort is normal.     Breath sounds: Normal breath sounds.  Abdominal:     General: Abdomen is flat. Bowel sounds are normal.     Tenderness: There is no abdominal tenderness. There is no guarding.  Musculoskeletal:        General: Normal range of motion.     Cervical back: Normal range of motion and neck supple.  Skin:    General: Skin is warm and dry.     Capillary Refill: Capillary refill takes less than 2 seconds.     Comments: Tiny raised lesions on the upper chest with the appears of "goose flesh"   Neurological:     General: No focal deficit present.     Mental Status: She is alert and oriented to person, place, and time.     Deep Tendon Reflexes: Reflexes normal.  Psychiatric:        Mood and Affect: Mood normal.        Behavior: Behavior normal.     ED Results / Procedures / Treatments   Labs (all labs ordered are listed, but only abnormal results are displayed) Labs Reviewed - No data to display  EKG None  Radiology No results found.  Procedures Procedures (including critical care time)  Medications Ordered in ED Medications  loratadine (CLARITIN) tablet 10 mg (has no administration in time range)    ED Course  I have reviewed the triage vital signs and the nursing notes.  Pertinent labs & imaging results that were available during my care of the patient were reviewed by me and considered in my medical decision making (see chart for details).    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  Mild soap, exfoliate and hydrate the skin.  May take claritin.  Likely due to the change in the weather.    Krista Adkins  was evaluated in Emergency Department on 07/17/2019 for the symptoms described in the history of present illness. She was evaluated in the context of the global COVID-19 pandemic, which necessitated consideration that the patient might be at risk for infection with the SARS-CoV-2 virus that causes COVID-19. Institutional protocols and algorithms that pertain to the  evaluation of patients at risk for COVID-19 are in a state of rapid change based on information released by regulatory bodies including the CDC and federal and state organizations. These policies and algorithms were followed during the patient's care in the ED.  Final Clinical Impression(s) / ED Diagnoses Return for weakness, numbness, changes in vision or speech, fevers >100.4 unrelieved by medication, shortness of breath, intractable vomiting, or diarrhea, abdominal pain, Inability to tolerate liquids or food, cough, altered mental status or any concerns. No signs of systemic illness or infection. The patient is nontoxic-appearing on exam and vital signs are within normal limits.   I have reviewed the triage vital signs and the nursing notes. Pertinent labs &imaging results that were available during my care of the patient were reviewed by me and considered in my medical decision making (see chart for details).  After history, exam, and medical workup I feel the patient has been appropriately medically screened and is safe for discharge home. Pertinent diagnoses were discussed with the patient. Patient was given return precautions    Karrine Kluttz, MD 07/17/19 [phone]

## 2019-07-17 NOTE — ED Notes (Signed)
Pt lying in bed, eye's closed, chest rising and falling. NAD noted. Will continue to monitor.  

## 2019-08-02 ENCOUNTER — Ambulatory Visit: Payer: Medicaid Other | Admitting: Neurology

## 2019-08-02 NOTE — Progress Notes (Deleted)
PATIENT: Krista Adkins DOB: [birth year]  REASON FOR VISIT: follow up HISTORY FROM: patient  HISTORY OF PRESENT ILLNESS: Today 08/02/19  Krista Adkins is a 24 year old female with history of multiple sclerosis.  She presented with history of paresthesias in 2018.  She had an MS exacerbation in May 2020, was treated with prednisone Dosepak.  She remains on Tecfidera.  Her JCV viral antibody is positive with index value of 2.26.  She has been on Tecfidera since June 2020.  MRI of the brain May 2020 showed at least 2 periventricular white matter lesions that are consistent with MS, she likely has a mildly enhancing cervical cord lesion as well.  Her thoracic spinal cord appears unremarkable.  HISTORY 02/01/2019 SS: Krista Adkins is a 24 year old female with history of multiple sclerosis.  She presented with history of paresthesias in 2018.  She had a recent MS exacerbation in May 2020 was treated with a prednisone Dosepak.  She remains on Tecfidera.  Her JC viral antibody is positive with index value of 2.26.  She has been taking Tecfidera since June 2020 and tolerating well.  While on the medication, she denies any numbness or weakness.  She continues to complain of aching in her hands on a daily basis.  She will have an occasional sharp pain in her back or legs. She denies any changes to her vision, bowels, or bladder.  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  She lives with her parents.  She is no longer taking gabapentin.  MRI of the brain in May 2020 showed at least 2 periventricular white matter lesions very consistent with multiple sclerosis, she likely has a mildly enhancing cervical cord lesion as well.  Her thoracic spinal cord appears to be unremarkable. She presents today for follow-up unaccompanied.   REVIEW OF SYSTEMS: Out of a complete 14 system review of symptoms, the patient complains only of the following symptoms, and all other reviewed systems are negative.  ALLERGIES: No Known  Allergies  HOME MEDICATIONS: Outpatient Medications Prior to Visit  Medication Sig Dispense Refill  . acetaminophen (TYLENOL) 325 MG tablet Take 650 mg by mouth every 6 (six) hours as needed for mild pain or headache.    . Dimethyl Fumarate (TECFIDERA) 240 MG CPDR Take 240 mg by mouth 2 (two) times daily.    . diphenhydrAMINE (BENADRYL) 25 MG tablet Take 25 mg by mouth every 6 (six) hours as needed for allergies.    . DULoxetine (CYMBALTA) 30 MG capsule Take 1 capsule (30 mg total) by mouth daily. 30 capsule 3  . ferrous sulfate 325 (65 FE) MG tablet Take 325 mg by mouth every other day.    . ibuprofen (ADVIL,MOTRIN) 600 MG tablet Take 1 tablet (600 mg total) by mouth every 6 (six) hours. 30 tablet 0  . predniSONE (DELTASONE) 10 MG tablet Begin taking 6 tablets daily, taper by one tablet every other day until off the medication. 42 tablet 0  . tobramycin (TOBREX) 0.3 % ophthalmic solution Place 2 drops into both eyes every 4 (four) hours. 5 mL 0   No facility-administered medications prior to visit.    PAST MEDICAL HISTORY: Past Medical History:  Diagnosis Date  . Anemia   . Medical history non-contributory   . Multiple sclerosis ([facility]) 09/30/2018  . SVD (spontaneous vaginal delivery) 09/29/2016    PAST SURGICAL HISTORY: Past Surgical History:  Procedure Laterality Date  . NO PAST SURGERIES      FAMILY HISTORY: No  family history on file.  SOCIAL HISTORY: Social History   Socioeconomic History  . Marital status: Single    Spouse name: Not on file  . Number of children: Not on file  . Years of education: Not on file  . Highest education level: Not on file  Occupational History  . Not on file  Tobacco Use  . Smoking status: Never Smoker  . Smokeless tobacco: Never Used  Substance and Sexual Activity  . Alcohol use: No  . Drug use: No  . Sexual activity: Not on file  Other Topics Concern  . Not on file  Social History Narrative  . Not on file   Social Determinants  of Health   Financial Resource Strain:   . Difficulty of Paying Living Expenses:   Food Insecurity:   . Worried About Programme researcher, broadcasting/film/video in the Last Year:   . Barista in the Last Year:   Transportation Needs:   . Freight forwarder (Medical):   Marland Kitchen Lack of Transportation (Non-Medical):   Physical Activity:   . Days of Exercise per Week:   . Minutes of Exercise per Session:   Stress:   . Feeling of Stress :   Social Connections:   . Frequency of Communication with Friends and Family:   . Frequency of Social Gatherings with Friends and Family:   . Attends Religious Services:   . Active Member of Clubs or Organizations:   . Attends Banker Meetings:   Marland Kitchen Marital Status:   Intimate Partner Violence:   . Fear of Current or Ex-Partner:   . Emotionally Abused:   Marland Kitchen Physically Abused:   . Sexually Abused:       PHYSICAL EXAM  There were no vitals filed for this visit. There is no height or weight on file to calculate BMI.  Generalized: Well developed, in no acute distress   Neurological examination  Mentation: Alert oriented to time, place, history taking. Follows all commands speech and language fluent Cranial nerve II-XII: Pupils were equal round reactive to light. Extraocular movements were full, visual field were full on confrontational test. Facial sensation and strength were normal. Uvula tongue midline. Head turning and shoulder shrug  were normal and symmetric. Motor: The motor testing reveals 5 over 5 strength of all 4 extremities. Good symmetric motor tone is noted throughout.  Sensory: Sensory testing is intact to soft touch on all 4 extremities. No evidence of extinction is noted.  Coordination: Cerebellar testing reveals good finger-nose-finger and heel-to-shin bilaterally.  Gait and station: Gait is normal. Tandem gait is normal. Romberg is negative. No drift is seen.  Reflexes: Deep tendon reflexes are symmetric and normal bilaterally.    DIAGNOSTIC DATA (LABS, IMAGING, TESTING) - I reviewed patient records, labs, notes, testing and imaging myself where available.  Lab Results  Component Value Date   WBC 6.9 02/01/2019   HGB 12.0 02/01/2019   HCT 38.0 02/01/2019   MCV 86 02/01/2019   PLT 293 02/01/2019      Component Value Date/Time   NA 135 02/01/2019 1358   K 4.4 02/01/2019 1358   CL 98 02/01/2019 1358   CO2 23 02/01/2019 1358   GLUCOSE 70 02/01/2019 1358   GLUCOSE 93 12/17/2018 1651   BUN 17 02/01/2019 1358   CREATININE 0.69 02/01/2019 1358   CALCIUM 9.6 02/01/2019 1358   PROT 7.4 02/01/2019 1358   ALBUMIN 4.8 02/01/2019 1358   AST 20 02/01/2019 1358   ALT 15  02/01/2019 1358   ALKPHOS 54 02/01/2019 1358   BILITOT 0.3 02/01/2019 1358   GFRNONAA 125 02/01/2019 1358   GFRAA 144 02/01/2019 1358   No results found for: CHOL, HDL, LDLCALC, LDLDIRECT, TRIG, CHOLHDL No results found for: HGBA1C Lab Results  Component Value Date   VITAMINB12 617 07/23/2018   No results found for: TSH    ASSESSMENT AND PLAN 24 y.o. year old female  has a past medical history of Anemia, Medical history non-contributory, Multiple sclerosis ([place]) (09/30/2018), and SVD (spontaneous vaginal delivery) (09/29/2016). here with ***   I spent 15 minutes with the patient. 50% of this time was spent   Butler Denmark, [place], [place] 08/02/2019, 5:41 AM [facility] [address] [place], [place] [address] [phone]

## 2019-08-04 ENCOUNTER — Encounter: Payer: Self-pay | Admitting: Neurology

## 2019-08-04 ENCOUNTER — Telehealth (INDEPENDENT_AMBULATORY_CARE_PROVIDER_SITE_OTHER): Payer: Medicaid Other | Admitting: Neurology

## 2019-08-04 DIAGNOSIS — G35 Multiple sclerosis: Secondary | ICD-10-CM | POA: Diagnosis not present

## 2019-08-04 MED ORDER — DIMETHYL FUMARATE 240 MG PO CPDR: 240.0000 mg | DELAYED_RELEASE_CAPSULE | Freq: Two times a day (BID) | ORAL | 11 refills | Status: DC

## 2019-08-04 NOTE — Progress Notes (Signed)
Virtual Visit via Video Note  I connected with Shaune Spittle on 08/04/19 at  9:45 AM EDT by a video enabled telemedicine application and verified that I am speaking with the correct person using two identifiers.  Location: Patient: at her home Provider: in the office   I discussed the limitations of evaluation and management by telemedicine and the availability of in person appointments. The patient expressed understanding and agreed to proceed.  History of Present Illness: 08/04/2019 SS: Ms. Buckels is a 24 year old female with history of multiple sclerosis.  She presented with history of paresthesias in 2018.  She had MS exacerbation in May 2020.  She remains on Tecfidera.  Her JC viral antibody is positive with index value of 2.26.  She has been on Tecfidera since June 2020.  She has continued to complain of aching in her hands on a daily basis, occasional sharp pain in her legs or back. MRI of the brain in May 2020 showed at least 2 periventricular white matter lesions very consistent with multiple sclerosis, she likely has a mildly enhancing cervical cord lesion as well.  Her thoracic spinal cord appears to be unremarkable.  She was started on Cymbalta at last visit for achy pain, previously was on gabapentin but was not taking.  She did not find Cymbalta be helpful, stopped it.  She denies any new symptoms.  She says her walking is doing better, she has not had any falls.  She lives with her parents.  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  She presents today for follow-up via virtual visit.  02/01/2019 SS: Ms. Cerro is a 24 year old female with history of multiple sclerosis.  She presented with history of paresthesias in 2018.  She had a recent MS exacerbation in May 2020 was treated with a prednisone Dosepak.  She remains on Tecfidera.  Her JC viral antibody is positive with index value of 2.26.  She has been taking Tecfidera since June 2020 and tolerating well.   While on the medication, she denies any numbness or weakness.  She continues to complain of aching in her hands on a daily basis.  She will have an occasional sharp pain in her back or legs. She denies any changes to her vision, bowels, or bladder.  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  She lives with her parents.  She is no longer taking gabapentin.  MRI of the brain in May 2020 showed at least 2 periventricular white matter lesions very consistent with multiple sclerosis, she likely has a mildly enhancing cervical cord lesion as well.  Her thoracic spinal cord appears to be unremarkable. She presents today for follow-up unaccompanied.   Observations/Objective: Via virtual visit, is alert and oriented, speech is clear and concise, facial symmetry noted, no arm drift, gait appears intact, steady, no assistive device, tandem gait is slightly unsteady  Assessment and Plan: 1.  Relapsing remitting multiple sclerosis.  She has been well since last seen, denies any recent exacerbation or new MS symptoms.  She will remain on Tecfidera. Last MRIs were in May 2020.  We will send myself EPIC reminder, plan to repeat in May 2021 for comparison and to check for stability.  Before MRIs, will come by the office to check CBC with differential, CMP.  She will follow-up in 6 months or sooner if needed.  She will stop Cymbalta, lack of benefit for achy sensation in hands.  Follow Up Instructions: 6 months  02/13/2020 11:15   I discussed the assessment and treatment plan with the patient. The patient was provided an opportunity to ask questions and all were answered. The patient agreed with the plan and demonstrated an understanding of the instructions.   The patient was advised to call back or seek an in-person evaluation if the symptoms worsen or if the condition fails to improve as anticipated.  I provided 20 minutes of non-face-to-face time during this encounter.  Evangeline Dakin, DNP  [facility] [address], [place], [place] [address] [phone]

## 2019-08-04 NOTE — Progress Notes (Signed)
I have read the note, and I agree with the clinical assessment and plan.  Krista Adkins [name]   

## 2019-09-01 ENCOUNTER — Telehealth: Payer: Self-pay | Admitting: Neurology

## 2019-09-01 DIAGNOSIS — G35 Multiple sclerosis: Secondary | ICD-10-CM

## 2019-09-01 NOTE — Telephone Encounter (Signed)
medicaid order sent to [facility]. They will obtain the auth and reach out to the patient to schedule.  °

## 2019-09-01 NOTE — Telephone Encounter (Signed)
Please call the patient. I saw her earlier this month, we were planning to repeat MRI of the brain and cervical spine in May 2021. She needs lab work beforehand. I will place the orders.

## 2019-09-01 NOTE — Telephone Encounter (Signed)
-----   Message from [name], NP sent at 08/04/2019  9:55 AM EDT ----- Call patient, order MRI of the brain and cervical spine, check cbc with diff, cmp

## 2019-09-01 NOTE — Telephone Encounter (Signed)
I called pt and she will come in for labs m-th 8-12, 05-1628.  MRI orders placed pt made aware to have labs prior to having MRI done.  She verbalized understanding.

## 2019-09-05 ENCOUNTER — Other Ambulatory Visit (INDEPENDENT_AMBULATORY_CARE_PROVIDER_SITE_OTHER): Payer: Self-pay

## 2019-09-05 ENCOUNTER — Other Ambulatory Visit: Payer: Self-pay

## 2019-09-05 DIAGNOSIS — Z0289 Encounter for other administrative examinations: Secondary | ICD-10-CM

## 2019-09-05 DIAGNOSIS — G35 Multiple sclerosis: Secondary | ICD-10-CM

## 2019-09-06 ENCOUNTER — Telehealth: Payer: Self-pay

## 2019-09-06 LAB — CBC WITH DIFFERENTIAL/PLATELET
Basophils Absolute: 0.1 10*3/uL (ref 0.0–0.2)
Basos: 1 %
EOS (ABSOLUTE): 0.2 10*3/uL (ref 0.0–0.4)
Eos: 4 %
Hematocrit: 38.9 % (ref 34.0–46.6)
Hemoglobin: 12.2 g/dL (ref 11.1–15.9)
Immature Grans (Abs): 0 10*3/uL (ref 0.0–0.1)
Immature Granulocytes: 0 %
Lymphocytes Absolute: 2.3 10*3/uL (ref 0.7–3.1)
Lymphs: 40 %
MCH: 28 pg (ref 26.6–33.0)
MCHC: 31.4 g/dL — ABNORMAL LOW (ref 31.5–35.7)
MCV: 89 fL (ref 79–97)
Monocytes Absolute: 0.4 10*3/uL (ref 0.1–0.9)
Monocytes: 7 %
Neutrophils Absolute: 2.8 10*3/uL (ref 1.4–7.0)
Neutrophils: 48 %
Platelets: 298 10*3/uL (ref 150–450)
RBC: 4.35 x10E6/uL (ref 3.77–5.28)
RDW: 12.2 % (ref 11.7–15.4)
WBC: 5.8 10*3/uL (ref 3.4–10.8)

## 2019-09-06 LAB — COMPREHENSIVE METABOLIC PANEL
ALT: 15 IU/L (ref 0–32)
AST: 22 IU/L (ref 0–40)
Albumin/Globulin Ratio: 1.6 (ref 1.2–2.2)
Albumin: 4.2 g/dL (ref 3.9–5.0)
Alkaline Phosphatase: 48 IU/L (ref 39–117)
BUN/Creatinine Ratio: 22 (ref 9–23)
BUN: 17 mg/dL (ref 6–20)
Bilirubin Total: <0.2 mg/dL (ref 0.0–1.2)
CO2: 24 mmol/L (ref 20–29)
Calcium: 9.2 mg/dL (ref 8.7–10.2)
Chloride: 104 mmol/L (ref 96–106)
Creatinine, Ser: 0.79 mg/dL (ref 0.57–1.00)
GFR calc Af Amer: 123 mL/min/{1.73_m2} (ref >59)
GFR calc non Af Amer: 107 mL/min/{1.73_m2} (ref >59)
Globulin, Total: 2.6 g/dL (ref 1.5–4.5)
Glucose: 88 mg/dL (ref 65–99)
Potassium: 4.6 mmol/L (ref 3.5–5.2)
Sodium: 139 mmol/L (ref 134–144)
Total Protein: 6.8 g/dL (ref 6.0–8.5)

## 2019-09-06 NOTE — Telephone Encounter (Signed)
Pt verified by name and DOB, results given per provider, pt voiced understanding all question answered. 

## 2019-09-26 ENCOUNTER — Other Ambulatory Visit: Payer: Self-pay | Admitting: Neurology

## 2019-09-29 ENCOUNTER — Ambulatory Visit
Admission: RE | Admit: 2019-09-29 | Discharge: 2019-09-29 | Disposition: A | Discharge status: Home / Self Care | Payer: Medicaid Other | Source: Ambulatory Visit | Attending: Neurology | Admitting: Neurology

## 2019-09-29 ENCOUNTER — Other Ambulatory Visit: Payer: Self-pay

## 2019-09-29 DIAGNOSIS — G35 Multiple sclerosis: Secondary | ICD-10-CM

## 2019-09-29 MED ORDER — GADOBENATE DIMEGLUMINE 529 MG/ML IV SOLN
15.0000 mL | Freq: Once | INTRAVENOUS | Status: AC | PRN
  Administered 2019-09-29: 15 mL via INTRAVENOUS

## 2019-10-04 ENCOUNTER — Telehealth: Payer: Self-pay | Admitting: Neurology

## 2019-10-04 NOTE — Telephone Encounter (Signed)
I called pt and relayed the MRI brain and cervical spine results to her. She verbalized understanding.  She understands to call us for any concerns. She will continue on tecfidera, she did not know if she was on generic or brand.  °

## 2019-10-04 NOTE — Telephone Encounter (Signed)
Please call the patient. Recent MRI of the brain shows 1 focus increased in size compared to previous while 2 other foci have improved appearance during this time, cervical spine shows 2 foci, were seen on last MRI, C6 lesion no longer enhances.   Consulted with Dr. Anne Hahn, regarding need to change MS medications. Will stay on Tecfidera for now, since clinically stable, but if any changes she should let us know. We will not hesitate to switch to more aggressive therapy such as Ocrevus or Gilenya. We will recheck MRI in 1 year. MRIs for comparison were done around the time of Tecfidera initiation.   IMPRESSION: This MRI of the brain with and without contrast shows the following: 1.   There are several T2/FLAIR hyperintense foci in the periventricular and deep white matter in a pattern and configuration consistent with chronic demyelinating plaque associated with multiple sclerosis.  None of the foci enhance or appear to be acute.  However, one focus involving the right internal capsule and adjacent thalamus is increased in size compared to the previous MRI while 2 other foci have been approved appearance during this time. 2.   There are no acute findings and there is a normal enhancement pattern.  IMPRESSION: This MRI of the cervical spine with and without contrast shows the following: 1.    Two foci within the spinal cord, one just below the cervicomedullary junction and one adjacent to the C6 vertebral body.  Both of these were apparent on the MRI from 09/23/2018.  The C6 lesion no longer enhances as it did last year.  These are consistent with chronic demyelinating plaque associated with multiple sclerosis. 2.    No degenerative changes are noted. 3.    There is a normal enhancement pattern.

## 2019-10-04 NOTE — Telephone Encounter (Signed)
I called pt and relayed the MRI brain and cervical spine results to her. She verbalized understanding.  She understands to call us for any concerns. She will continue on tecfidera, she did not know if she was on generic or brand.

## 2019-10-08 IMAGING — CR CHEST - 2 VIEW
1 series · 2 of 2 positions shown · non-contrast
Comparison: None.

CLINICAL DATA: Chest tightness.

EXAM:
CHEST - 2 VIEW

[Series 1: dg chest 2 view · 0.14mm/px · 2 of 2 slices shown]
[im 1/2]
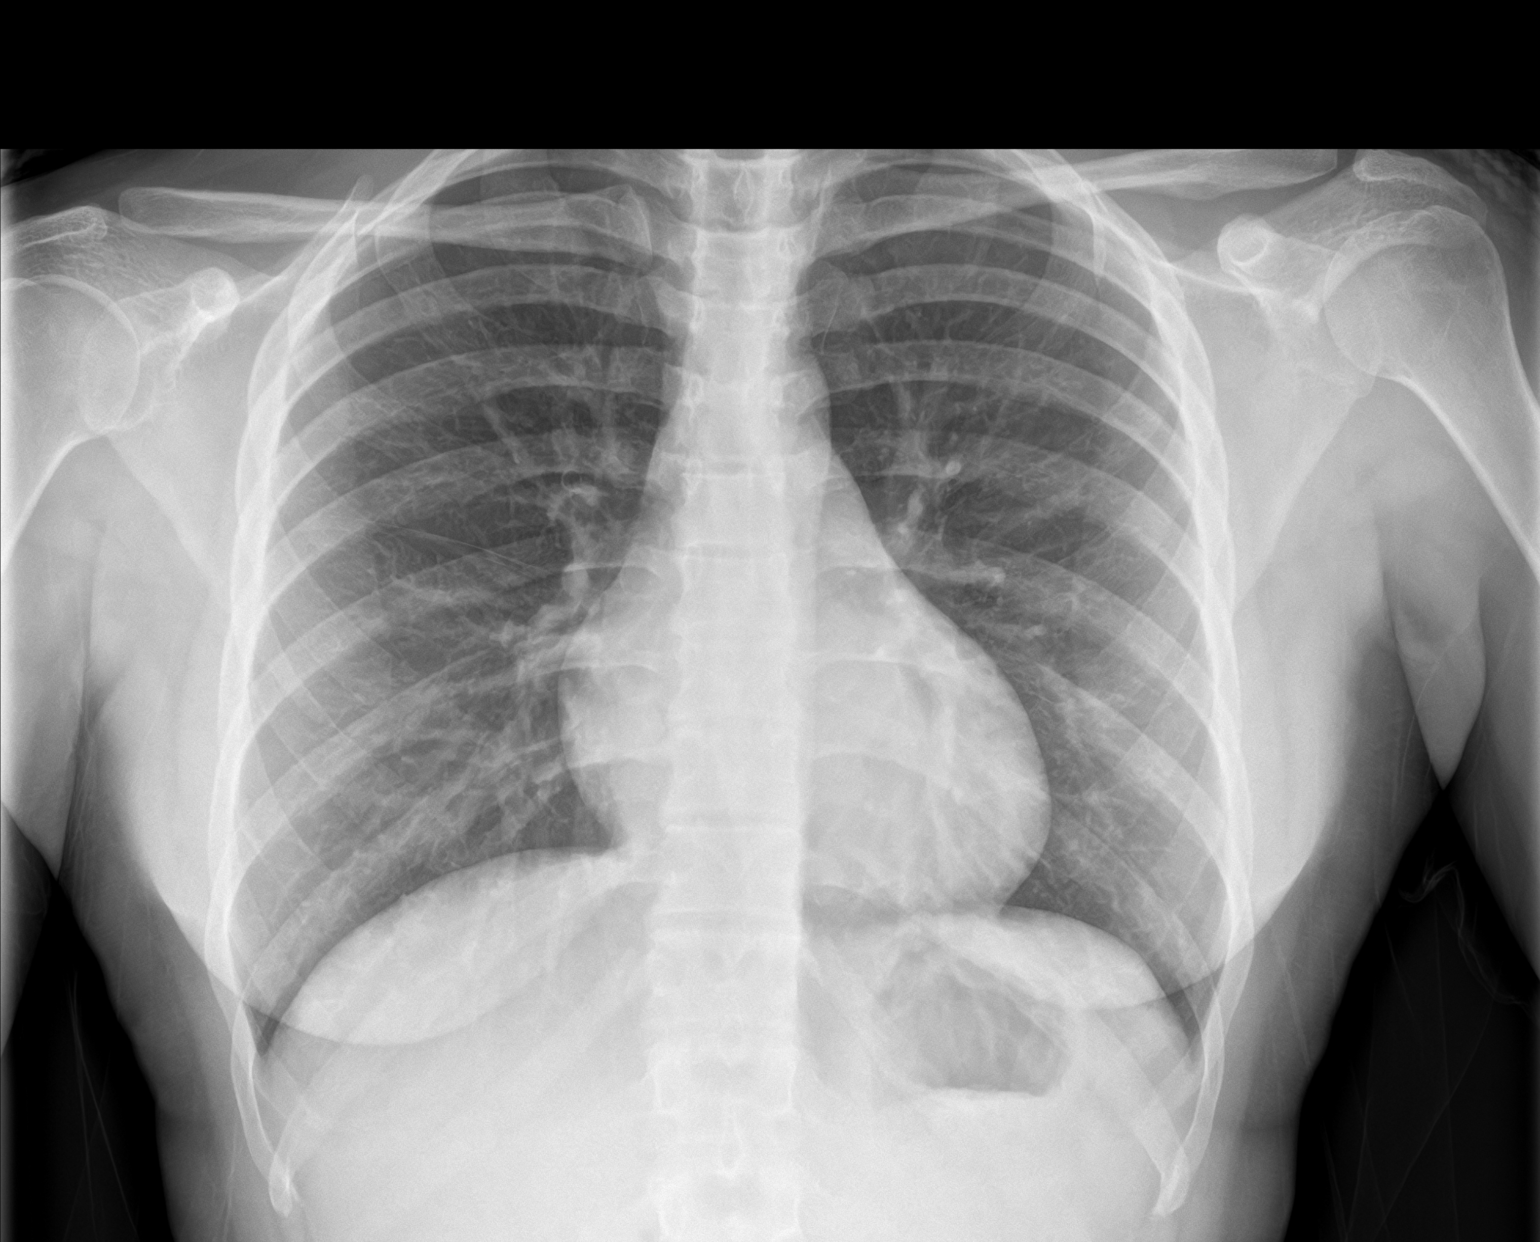
[im 2/2]
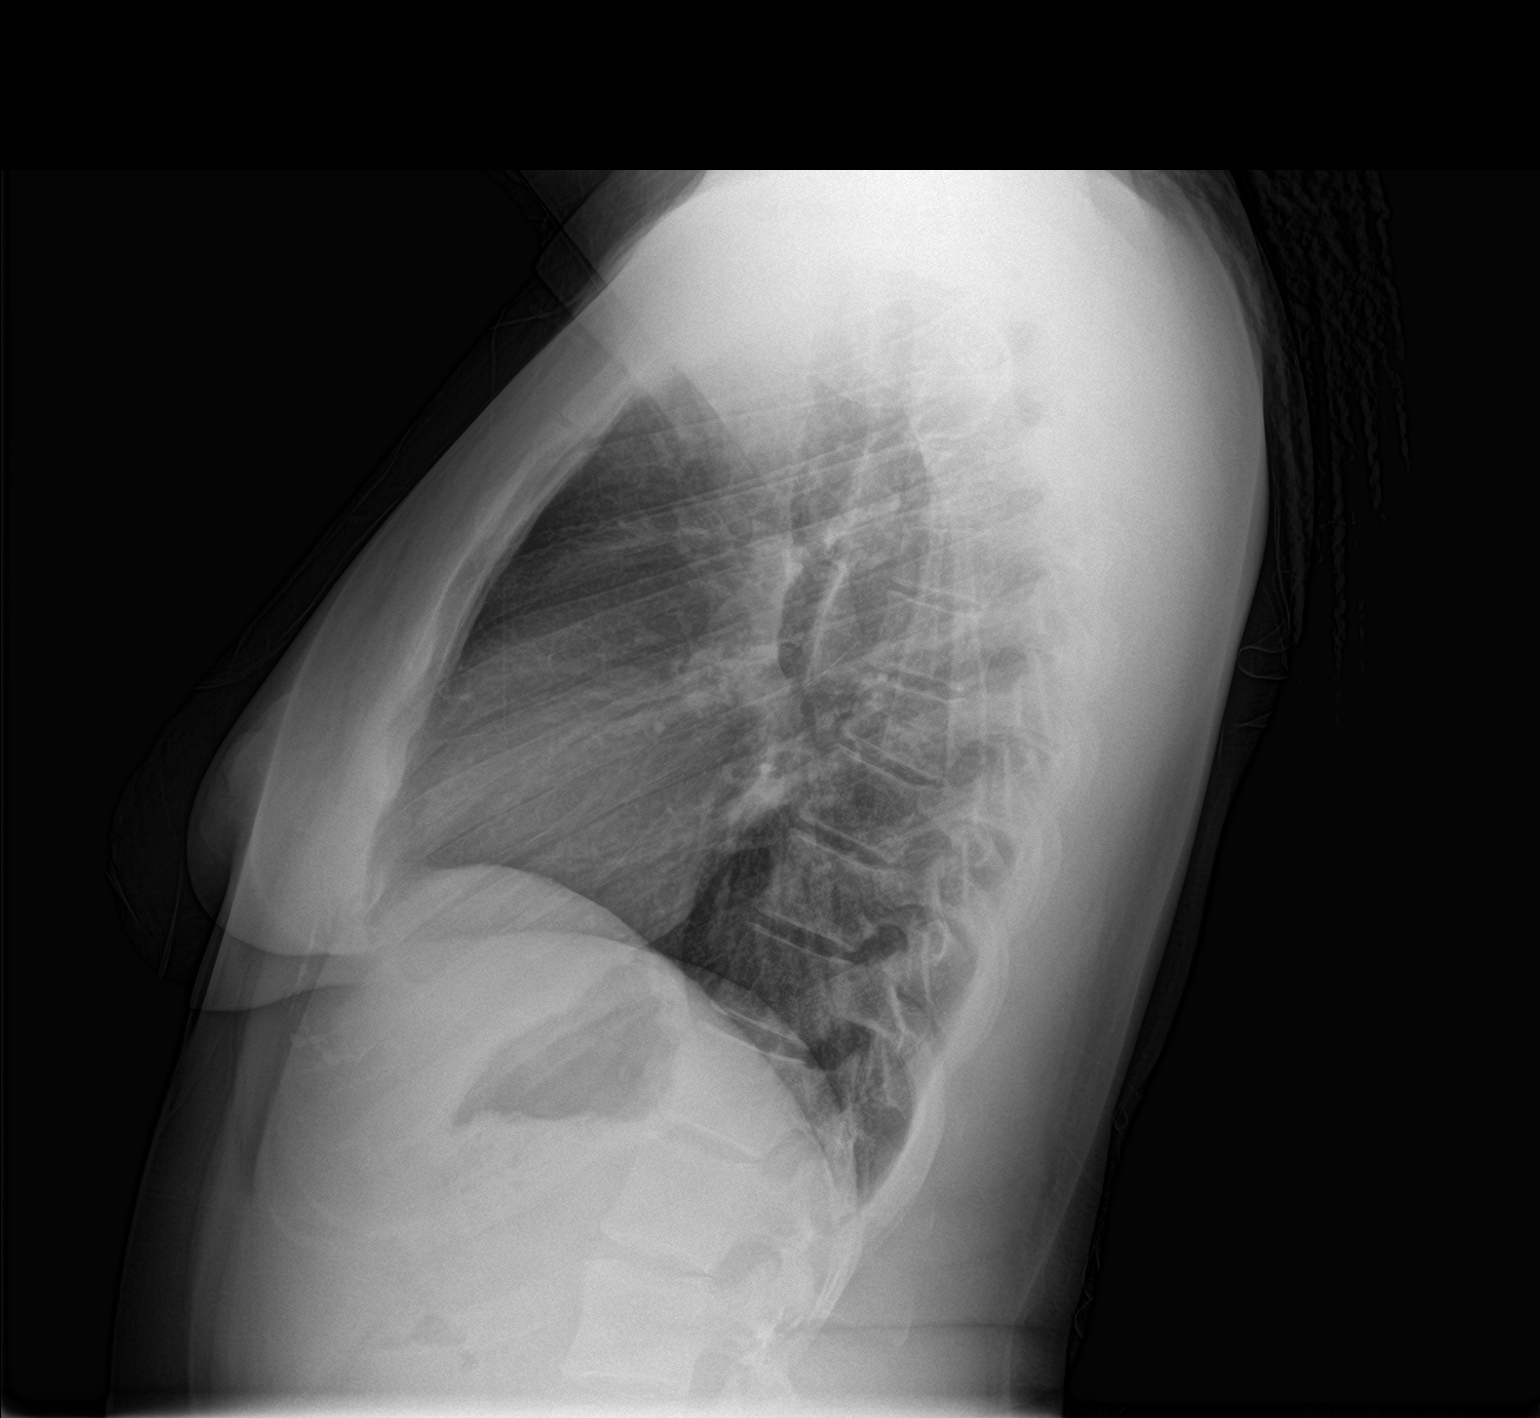

[2 of 2 positions shown; findings below may reference images not displayed]

FINDINGS: The heart size and mediastinal contours are within normal limits.
Both lungs are clear. The visualized skeletal structures are
unremarkable.
IMPRESSION: No active cardiopulmonary disease.

## 2020-01-31 ENCOUNTER — Encounter: Payer: Self-pay | Admitting: Neurology

## 2020-02-13 ENCOUNTER — Ambulatory Visit: Payer: Medicaid Other | Admitting: Neurology

## 2020-02-15 ENCOUNTER — Encounter: Payer: Self-pay | Admitting: Neurology

## 2020-02-15 ENCOUNTER — Ambulatory Visit: Payer: Medicaid Other | Admitting: Neurology

## 2020-02-15 VITALS — BP 112/68 | HR 64 | Ht 66.0 in | Wt 145.0 lb

## 2020-02-15 DIAGNOSIS — G35 Multiple sclerosis: Secondary | ICD-10-CM

## 2020-02-15 MED ORDER — DIMETHYL FUMARATE 240 MG PO CPDR: 240.0000 mg | DELAYED_RELEASE_CAPSULE | Freq: Two times a day (BID) | ORAL | 11 refills | Status: DC

## 2020-02-15 NOTE — Patient Instructions (Signed)
Check blood work today  Continue Tecfidera Check MRIs at next visit See you in 6 months

## 2020-02-15 NOTE — Progress Notes (Signed)
PATIENT: Krista Adkins DOB: May 22, 1995  REASON FOR VISIT: follow up HISTORY FROM: patient  HISTORY OF PRESENT ILLNESS: Today 02/15/20  Krista Adkins is a 24 year old female with history of multiple sclerosis, presented with paresthesias in 2018.  MRI of the brain in May 2021 shows 1 focus in the right internal capsule and adjacent thalamus increased in size compared to previous 2020 MRI, 2 other foci have improved in appearance.  MRI of cervical spine, was stable, 2 foci in the spinal cord, were present on 2020 MRI, the C6 lesion no longer enhanced.  Since clinically stable, she has remained on Tecfidera, will repeat MRIs in 1 year, MRI in 2020 was done around the time of Tecfidera initiation. Her JC viral antibody is positive 2.26, has been on Tecfidera since June 2020, tolerating well.  Is overall stable, no new MS symptoms.  She has chronic achy pain to her hands, Cymbalta, nor gabapentin are helpful.  Occasionally lose her balance, may trip, no falls.  No changes to the vision or B/B.  No numbness or weakness to the arms or legs.  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  Here today for follow-up unaccompanied.  HISTORY 08/04/2019 SS: Krista Adkins is a 24 year old female with history of multiple sclerosis.  She presented with history of paresthesias in 2018.  She had MS exacerbation in May 2020.  She remains on Tecfidera.  Her JC viral antibody is positive with index value of 2.26.  She has been on Tecfidera since June 2020.  She has continued to complain of aching in her hands on a daily basis, occasional sharp pain in her legs or back. MRI of the brain in May 2020 showed at least 2 periventricular white matter lesions very consistent with multiple sclerosis, she likely has a mildly enhancing cervical cord lesion as well. Her thoracic spinal cord appears to be unremarkable.  She was started on Cymbalta at last visit for achy pain, previously was on  gabapentin but was not taking.  She did not find Cymbalta be helpful, stopped it.  She denies any new symptoms.  She says her walking is doing better, she has not had any falls.  She lives with her parents.  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  She presents today for follow-up via virtual visit.   REVIEW OF SYSTEMS: Out of a complete 14 system review of symptoms, the patient complains only of the following symptoms, and all other reviewed systems are negative.  Hand pain  ALLERGIES: No Known Allergies  HOME MEDICATIONS: Outpatient Medications Prior to Visit  Medication Sig Dispense Refill  . acetaminophen (TYLENOL) 325 MG tablet Take 650 mg by mouth every 6 (six) hours as needed for mild pain or headache.    . diphenhydrAMINE (BENADRYL) 25 MG tablet Take 25 mg by mouth every 6 (six) hours as needed for allergies.    . ferrous sulfate 325 (65 FE) MG tablet Take 325 mg by mouth every other day.    . ibuprofen (ADVIL,MOTRIN) 600 MG tablet Take 1 tablet (600 mg total) by mouth every 6 (six) hours. 30 tablet 0  . tobramycin (TOBREX) 0.3 % ophthalmic solution Place 2 drops into both eyes every 4 (four) hours. 5 mL 0  . Dimethyl Fumarate (TECFIDERA) 240 MG CPDR Take 1 capsule (240 mg total) by mouth 2 (two) times daily. 60 capsule 11   No facility-administered medications prior to visit.  PAST MEDICAL HISTORY: Past Medical History:  Diagnosis Date  . Anemia   . Medical history non-contributory   . Multiple sclerosis (HCC) 09/30/2018  . SVD (spontaneous vaginal delivery) 09/29/2016    PAST SURGICAL HISTORY: Past Surgical History:  Procedure Laterality Date  . NO PAST SURGERIES      FAMILY HISTORY: No family history on file.  SOCIAL HISTORY: Social History   Socioeconomic History  . Marital status: Single    Spouse name: Not on file  . Number of children: Not on file  . Years of education: Not on file  . Highest education level: Not on file    Occupational History  . Not on file  Tobacco Use  . Smoking status: Never Smoker  . Smokeless tobacco: Never Used  Substance and Sexual Activity  . Alcohol use: No  . Drug use: No  . Sexual activity: Not on file  Other Topics Concern  . Not on file  Social History Narrative  . Not on file   Social Determinants of Health   Financial Resource Strain:   . Difficulty of Paying Living Expenses: Not on file  Food Insecurity:   . Worried About Programme researcher, broadcasting/film/video in the Last Year: Not on file  . Ran Out of Food in the Last Year: Not on file  Transportation Needs:   . Lack of Transportation (Medical): Not on file  . Lack of Transportation (Non-Medical): Not on file  Physical Activity:   . Days of Exercise per Week: Not on file  . Minutes of Exercise per Session: Not on file  Stress:   . Feeling of Stress : Not on file  Social Connections:   . Frequency of Communication with Friends and Family: Not on file  . Frequency of Social Gatherings with Friends and Family: Not on file  . Attends Religious Services: Not on file  . Active Member of Clubs or Organizations: Not on file  . Attends Banker Meetings: Not on file  . Marital Status: Not on file  Intimate Partner Violence:   . Fear of Current or Ex-Partner: Not on file  . Emotionally Abused: Not on file  . Physically Abused: Not on file  . Sexually Abused: Not on file    PHYSICAL EXAM  Vitals:   02/15/20 0829  BP: 112/68  Pulse: 64  Weight: 145 lb (65.8 kg)  Height: 5\' 6"  (1.676 m)   Body mass index is 23.4 kg/m.  Generalized: Well developed, in no acute distress   Neurological examination  Mentation: Alert oriented to time, place, history taking. Follows all commands speech and language fluent Cranial nerve II-XII: Pupils were equal round reactive to light. Extraocular movements were full, visual field were full on confrontational test. Facial sensation and strength were normal. Head turning and  shoulder shrug  were normal and symmetric. Motor: The motor testing reveals 5 over 5 strength of all 4 extremities. Good symmetric motor tone is noted throughout.  Sensory: Sensory testing is intact to soft touch on all 4 extremities. No evidence of extinction is noted.  Coordination: Cerebellar testing reveals good finger-nose-finger and heel-to-shin bilaterally.  Gait and station: Gait is normal. Tandem gait is normal. Romberg is negative. No drift is seen.  Reflexes: Deep tendon reflexes are symmetric and normal bilaterally.   DIAGNOSTIC DATA (LABS, IMAGING, TESTING) - I reviewed patient records, labs, notes, testing and imaging myself where available.  Lab Results  Component Value Date   WBC 5.8 09/05/2019  HGB 12.2 09/05/2019   HCT 38.9 09/05/2019   MCV 89 09/05/2019   PLT 298 09/05/2019      Component Value Date/Time   NA 139 09/05/2019 0838   K 4.6 09/05/2019 0838   CL 104 09/05/2019 0838   CO2 24 09/05/2019 0838   GLUCOSE 88 09/05/2019 0838   GLUCOSE 93 12/17/2018 1651   BUN 17 09/05/2019 0838   CREATININE 0.79 09/05/2019 0838   CALCIUM 9.2 09/05/2019 0838   PROT 6.8 09/05/2019 0838   ALBUMIN 4.2 09/05/2019 0838   AST 22 09/05/2019 0838   ALT 15 09/05/2019 0838   ALKPHOS 48 09/05/2019 0838   BILITOT <0.2 09/05/2019 0838   GFRNONAA 107 09/05/2019 0838   GFRAA 123 09/05/2019 0838   No results found for: CHOL, HDL, LDLCALC, LDLDIRECT, TRIG, CHOLHDL No results found for: GHWE9H Lab Results  Component Value Date   VITAMINB12 617 07/23/2018   No results found for: TSH  ASSESSMENT AND PLAN 24 y.o. year old female  has a past medical history of Anemia, Medical history non-contributory, Multiple sclerosis (HCC) (09/30/2018), and SVD (spontaneous vaginal delivery) (09/29/2016). here with:  1.  Relapsing remitting multiple sclerosis  -Overall, clinically stable  -Continue Tecfidera 240 mg twice daily  -Check CBC with differential, CMP today  -Plan to check MRI of  the brain and cervical spine at next visit (MRIs in May 2021 of the brain showed one focus increased in size compared to previous MRI in 2020, 2 other foci have improved appearance during this time, cervical spine shows 2 foci were seen on last MRI, C6 lesion no longer enhances, MRI in 2020 was done around the time of Tecfidera initiation)  -Call for any new or worsening symptoms  -Follow-up in 6 months or sooner if needed  I spent 30 minutes of face-to-face and non-face-to-face time with patient.  This included previsit chart review, lab review, study review, order entry, electronic health record documentation, patient education.  Margie Ege, AGNP-C, DNP 02/15/2020, 9:11 AM [facility] [address], [address] [place], [place] [address] [phone]

## 2020-02-15 NOTE — Progress Notes (Signed)
I have read the note, and I agree with the clinical assessment and plan.  Vittoria Noreen K Jordy Verba   

## 2020-02-16 ENCOUNTER — Telehealth: Payer: Self-pay | Admitting: *Deleted

## 2020-02-16 LAB — COMPREHENSIVE METABOLIC PANEL WITH GFR
ALT: 15 IU/L (ref 0–32)
AST: 17 IU/L (ref 0–40)
Albumin/Globulin Ratio: 1.8 (ref 1.2–2.2)
Albumin: 4.5 g/dL (ref 3.9–5.0)
Alkaline Phosphatase: 50 IU/L (ref 44–121)
BUN/Creatinine Ratio: 14 (ref 9–23)
BUN: 11 mg/dL (ref 6–20)
Bilirubin Total: 0.3 mg/dL (ref 0.0–1.2)
CO2: 23 mmol/L (ref 20–29)
Calcium: 9.3 mg/dL (ref 8.7–10.2)
Chloride: 106 mmol/L (ref 96–106)
Creatinine, Ser: 0.79 mg/dL (ref 0.57–1.00)
GFR calc Af Amer: 123 mL/min/1.73
GFR calc non Af Amer: 107 mL/min/1.73
Globulin, Total: 2.5 g/dL (ref 1.5–4.5)
Glucose: 87 mg/dL (ref 65–99)
Potassium: 4.5 mmol/L (ref 3.5–5.2)
Sodium: 142 mmol/L (ref 134–144)
Total Protein: 7 g/dL (ref 6.0–8.5)

## 2020-02-16 LAB — CBC WITH DIFFERENTIAL/PLATELET
Basophils Absolute: 0 x10E3/uL (ref 0.0–0.2)
Basos: 0 %
EOS (ABSOLUTE): 0 x10E3/uL (ref 0.0–0.4)
Eos: 1 %
Hematocrit: 40 % (ref 34.0–46.6)
Hemoglobin: 13 g/dL (ref 11.1–15.9)
Immature Grans (Abs): 0 x10E3/uL (ref 0.0–0.1)
Immature Granulocytes: 0 %
Lymphocytes Absolute: 1.5 x10E3/uL (ref 0.7–3.1)
Lymphs: 21 %
MCH: 29.1 pg (ref 26.6–33.0)
MCHC: 32.5 g/dL (ref 31.5–35.7)
MCV: 90 fL (ref 79–97)
Monocytes Absolute: 0.5 x10E3/uL (ref 0.1–0.9)
Monocytes: 7 %
Neutrophils Absolute: 5.1 x10E3/uL (ref 1.4–7.0)
Neutrophils: 71 %
Platelets: 305 x10E3/uL (ref 150–450)
RBC: 4.47 x10E6/uL (ref 3.77–5.28)
RDW: 12.2 % (ref 11.7–15.4)
WBC: 7.1 x10E3/uL (ref 3.4–10.8)

## 2020-02-16 NOTE — Telephone Encounter (Signed)
sent pt mychart message with lab results (unremarkable).

## 2020-02-21 ENCOUNTER — Telehealth: Payer: Self-pay | Admitting: Neurology

## 2020-02-21 NOTE — Telephone Encounter (Signed)
[name], NP  Maurene Capes, CMA Labs are unremarkable on Tecfidera, no significant abnormalities.   Left message for patient to call back for results.

## 2020-07-01 ENCOUNTER — Encounter: Payer: Self-pay | Admitting: Emergency Medicine

## 2020-07-01 ENCOUNTER — Emergency Department
Admission: EM | Admit: 2020-07-01 | Discharge: 2020-07-01 | Disposition: A | Discharge status: Home / Self Care | Payer: Medicaid Other | Attending: Emergency Medicine | Admitting: Emergency Medicine

## 2020-07-01 ENCOUNTER — Other Ambulatory Visit: Payer: Self-pay

## 2020-07-01 DIAGNOSIS — H00014 Hordeolum externum left upper eyelid: Secondary | ICD-10-CM | POA: Insufficient documentation

## 2020-07-01 MED ORDER — ERYTHROMYCIN 5 MG/GM OP OINT: 1.0000 "application " | TOPICAL_OINTMENT | Freq: Once | OPHTHALMIC | 0 refills | Status: AC

## 2020-07-01 MED ORDER — CEPHALEXIN 500 MG PO CAPS: 500.0000 mg | ORAL_CAPSULE | Freq: Four times a day (QID) | ORAL | 0 refills | Status: AC

## 2020-07-01 NOTE — ED Provider Notes (Signed)
[facility] EMERGENCY DEPARTMENT Provider Note   CSN: [id number] Arrival date & time: 07/01/20  1321     History Chief Complaint  Patient presents with  . Eye Pain    Krista Adkins is a 25 y.o. female presents to the emergency department for evaluation of left eye stye along the upper eyelid.  Stye has been present for 3 days.  Patient has been trying warm compresses with little relief.  In a similar stye on the right upper eyelid and a couple weeks ago that resolved with compresses.  She denies any drainage.  No eye pain, redness, foreign body sensation.  No headache or rashes.  No pain with EOM.  HPI     Past Medical History:  Diagnosis Date  . Anemia   . Medical history non-contributory   . Multiple sclerosis (HCC) 09/30/2018  . SVD (spontaneous vaginal delivery) 09/29/2016    Patient Active Problem List   Diagnosis Date Noted  . Multiple sclerosis (HCC) 09/30/2018  . Indication for care in labor or delivery 09/29/2016  . SVD (spontaneous vaginal delivery) 09/29/2016  . Supervision of normal pregnancy, antepartum 03/17/2016    Past Surgical History:  Procedure Laterality Date  . NO PAST SURGERIES       OB History    Gravida  1   Para  1   Term  1   Preterm      AB      Living  1     SAB      IAB      Ectopic      Multiple  0   Live Births  1           No family history on file.  Social History   Tobacco Use  . Smoking status: Never Smoker  . Smokeless tobacco: Never Used  Substance Use Topics  . Alcohol use: No  . Drug use: No    Home Medications Prior to Admission medications   Medication Sig Start Date End Date Taking? Authorizing Provider  cephALEXin (KEFLEX) 500 MG capsule Take 1 capsule (500 mg total) by mouth 4 (four) times daily for 7 days. 07/01/20 07/08/20 Yes Evon Slack, PA-C  acetaminophen (TYLENOL) 325 MG tablet Take 650 mg by mouth every 6 (six) hours as needed for mild pain or headache.     [provider]  Dimethyl Fumarate (TECFIDERA) 240 MG CPDR Take 1 capsule (240 mg total) by mouth 2 (two) times daily. 02/15/20   [name], NP  diphenhydrAMINE (BENADRYL) 25 MG tablet Take 25 mg by mouth every 6 (six) hours as needed for allergies.    [provider]  ferrous sulfate 325 (65 FE) MG tablet Take 325 mg by mouth every other day.    [provider]  ibuprofen (ADVIL,MOTRIN) 600 MG tablet Take 1 tablet (600 mg total) by mouth every 6 (six) hours. 10/01/16   Huel Cote, MD  tobramycin (TOBREX) 0.3 % ophthalmic solution Place 2 drops into both eyes every 4 (four) hours. 04/24/19   Faythe Ghee, PA-C    Allergies    Patient has no known allergies.  Review of Systems   Review of Systems  Constitutional: Negative for fever.  Eyes: Negative for photophobia, pain, discharge, redness, itching and visual disturbance.  Gastrointestinal: Negative for nausea and vomiting.  Skin: Positive for wound.  Neurological: Negative for headaches.    Physical Exam Updated Vital Signs BP 123/68 (BP Location: Right Arm)  Pulse 69   Temp 98.3 F (36.8 C) (Oral)   Resp 20   Ht 5\' 7"  (1.702 m)   Wt 63.5 kg   SpO2 99%   BMI 21.93 kg/m   Physical Exam Constitutional:      Appearance: She is well-developed and well-nourished.  HENT:     Head: Normocephalic and atraumatic.  Eyes:     General:        Right eye: No discharge.        Left eye: No discharge.     Extraocular Movements: Extraocular movements intact.     Conjunctiva/sclera: Conjunctivae normal.     Pupils: Pupils are equal, round, and reactive to light.     Comments: Left upper eyelid with stye, small 0.25 cm area of erythema, no drainage.  No surrounding cellulitis.  Full EOM with no discomfort.  No sign of foreign body  Cardiovascular:     Rate and Rhythm: Normal rate.  Pulmonary:     Effort: Pulmonary effort is normal. No respiratory distress.  Musculoskeletal:        General:  Normal range of motion.     Cervical back: Normal range of motion.  Skin:    General: Skin is warm.     Findings: No rash.  Neurological:     Mental Status: She is alert and oriented to person, place, and time.  Psychiatric:        Mood and Affect: Mood and affect normal.        Behavior: Behavior normal.        Thought Content: Thought content normal.     ED Results / Procedures / Treatments   Labs (all labs ordered are listed, but only abnormal results are displayed) Labs Reviewed - No data to display  EKG None  Radiology No results found.  Procedures Procedures   Medications Ordered in ED Medications - No data to display  ED Course  I have reviewed the triage vital signs and the nursing notes.  Pertinent labs & imaging results that were available during my care of the patient were reviewed by me and considered in my medical decision making (see chart for details).    MDM Rules/Calculators/A&P                          25 year old female with left upper eyelid stye.  No signs of spreading cellulitis.  No sign of preseptal cellulitis.  Normal EOM.  No eye pain or erythema.  No eye drainage.  Patient will continue with warm compresses and will start taking oral cephalexin.  She understands signs symptoms return to the ER for. Final Clinical Impression(s) / ED Diagnoses Final diagnoses:  Hordeolum externum of left upper eyelid    Rx / DC Orders ED Discharge Orders         Ordered    cephALEXin (KEFLEX) 500 MG capsule  4 times daily        07/01/20 1344           07/03/20 07/01/20 1353    07/03/20, MD 07/01/20 [phone]

## 2020-07-01 NOTE — ED Triage Notes (Signed)
Pt reports pain to her left eye since Thursday, denies injuries.

## 2020-07-01 NOTE — Discharge Instructions (Addendum)
Please continue with warm compresses.  Take antibiotics as prescribed.  Return to the ER for any increasing pain swelling warmth redness or vision changes.

## 2020-07-31 ENCOUNTER — Telehealth: Payer: Self-pay | Admitting: *Deleted

## 2020-07-31 NOTE — Telephone Encounter (Signed)
Received fax from [employer], [place]. Clinical questions answered, placed on MD desk for signature.

## 2020-08-01 NOTE — Telephone Encounter (Signed)
Tecfidera PA form signed, faxed with last office note.

## 2020-08-07 NOTE — Telephone Encounter (Signed)
Called [place], [employer] , LVM requesting call back re: status of PA faxed to her on 08/01/20.

## 2020-08-08 NOTE — Telephone Encounter (Signed)
Called Tiffany Jorene Minors, LVM #2 requesting call back re: Tecfidera PA status.

## 2020-08-15 ENCOUNTER — Ambulatory Visit: Payer: Medicaid Other | Admitting: Neurology

## 2020-08-15 ENCOUNTER — Encounter: Payer: Self-pay | Admitting: *Deleted

## 2020-08-15 NOTE — Telephone Encounter (Signed)
Made 3rd attempt to reach Tiffany re: Tecfidera PA,, LVM #3. Called [facility] provider PA line, spoke with [place] PA dept who stated they are not her primary insurance. They are [facility] [employer], her 2ndary. Her primary insurance would need PA and pay then [facility]  would pay remaining balance. If patient has no primary insurance she must call member services # for [facility] and let them know. They will investigate. If she actually doesn't have a primary [employer] will become her primary and PA can be done with them. Called patient LVM requesting call back for insurance information.

## 2020-08-20 NOTE — Telephone Encounter (Addendum)
Called [place] [facility], spoke with Dasha who stated the patient no longer has Medicaid direct insurance. She is managed under [facility] community, PA must be done through [facility]. [id number] This call Ref #  [id number]. Per my conversation with [facility] on 08/15/20 I;ll call patient advising she must call [facility] customer service to give them this information.  Called patient and advised her of what she needs to do. She stated that this began with her college putting her on [facility] commnuity. She stated she told them to put her back on Medicaid but thinks they must not have done that. She stated she's been out of tecfidera for a few months but stated other than a few neck pains she has felt well. I advised she call [facility] customer service and get it straightened out. Let us know when she has positive insurance ocverage so we can do PA for tecfidera. She verbalized understanding, appreciation. Sent e mail to [employer] asking if patient can be sent free drug while waiting on insurance.

## 2020-08-21 NOTE — Telephone Encounter (Signed)
Received reply from [employer]: they can send a complimentary dose of Tecfidera to patient while we try to get approved through insurance.  Still waiting on her to call with correct current coverage information.

## 2020-08-21 NOTE — Telephone Encounter (Signed)
Per [employer]: " I was able to reach the patient and requested a 21 day complimentary dose."

## 2020-08-27 NOTE — Telephone Encounter (Signed)
Faxed [employer] Free Drug Patient Assistance Program Prescription Request to [employer].  OK transmission received.

## 2020-08-27 NOTE — Telephone Encounter (Signed)
Received e mail from [employer] asking for Tecfidera 21 day supply to be called to [facility] [employer] [facility]. This is to bridge patient until  she gets [place] her correct insurance information. Krista Adkins, spoke with  [name], pharmacist, gave verbal RX for Tecfidera 240 mg tabs, take one tab twice daily. Disp #42 tabs /21 day supply.  He stated they will get Rx to patient.  Called patient, LVM advising her of temporary supply form [facility]. Asked her to let us know her current insurance, ideally need photos of front/back of card. She can send those via my chart,  left #.

## 2020-09-13 NOTE — Telephone Encounter (Signed)
Per NP called patient to schedule follow up to get her back on therapy. LVM with # and  advised we now have her insurance information. We need her to call back and schedule a FU with Maralyn Sago NP.

## 2020-09-13 NOTE — Telephone Encounter (Signed)
Received current insurance from [employer]: [employer] id #[id number], group [facility]. Called [employer],  to initiate tecfidera PA. Was transferred x 3 to "dedicated team" ph  [phone], spoke with Alcario Drought who stated medication doesn't require PA.  [facility], spoke with Madia who stated patient was discharged from their service in Feb 2022 because she never provided updated insurance info. Her last shipment of tecfidera was 02/22/20. She stated a new Rx can be sent to them electronically to get her back on medication.  Transferred me to insurance dept, spoke with Aram Beecham and gave her insurance id #. She will call for BIN, PCN. She  verbalized understanding, appreciation.

## 2020-09-19 ENCOUNTER — Encounter: Payer: Self-pay | Admitting: *Deleted

## 2023-08-12 ENCOUNTER — Telehealth: Admitting: Physician Assistant

## 2023-08-12 ENCOUNTER — Telehealth

## 2023-08-12 ENCOUNTER — Emergency Department (HOSPITAL_COMMUNITY)
Admission: EM | Admit: 2023-08-12 | Discharge: 2023-08-12 | Discharge status: Against Medical Advice | Attending: Emergency Medicine | Admitting: Emergency Medicine

## 2023-08-12 ENCOUNTER — Ambulatory Visit

## 2023-08-12 ENCOUNTER — Other Ambulatory Visit: Payer: Self-pay

## 2023-08-12 DIAGNOSIS — M542 Cervicalgia: Secondary | ICD-10-CM | POA: Insufficient documentation

## 2023-08-12 DIAGNOSIS — Z5321 Procedure and treatment not carried out due to patient leaving prior to being seen by health care provider: Secondary | ICD-10-CM | POA: Insufficient documentation

## 2023-08-12 DIAGNOSIS — G35 Multiple sclerosis: Secondary | ICD-10-CM

## 2023-08-12 DIAGNOSIS — M549 Dorsalgia, unspecified: Secondary | ICD-10-CM | POA: Insufficient documentation

## 2023-08-12 LAB — CBC WITH DIFFERENTIAL/PLATELET
Abs Immature Granulocytes: 0.01 10*3/uL (ref 0.00–0.07)
Basophils Absolute: 0 10*3/uL (ref 0.0–0.1)
Basophils Relative: 1 %
Eosinophils Absolute: 0.3 10*3/uL (ref 0.0–0.5)
Eosinophils Relative: 5 %
HCT: 41.3 % (ref 36.0–46.0)
Hemoglobin: 13.8 g/dL (ref 12.0–15.0)
Immature Granulocytes: 0 %
Lymphocytes Relative: 39 %
Lymphs Abs: 2.3 10*3/uL (ref 0.7–4.0)
MCH: 31 pg (ref 26.0–34.0)
MCHC: 33.4 g/dL (ref 30.0–36.0)
MCV: 92.8 fL (ref 80.0–100.0)
Monocytes Absolute: 0.4 10*3/uL (ref 0.1–1.0)
Monocytes Relative: 7 %
Neutro Abs: 2.9 10*3/uL (ref 1.7–7.7)
Neutrophils Relative %: 48 %
Platelets: 261 10*3/uL (ref 150–400)
RBC: 4.45 MIL/uL (ref 3.87–5.11)
RDW: 13.2 % (ref 11.5–15.5)
WBC: 5.9 10*3/uL (ref 4.0–10.5)
nRBC: 0 % (ref 0.0–0.2)

## 2023-08-12 LAB — BASIC METABOLIC PANEL WITH GFR
Anion gap: 9 (ref 5–15)
BUN: 11 mg/dL (ref 6–20)
CO2: 24 mmol/L (ref 22–32)
Calcium: 9.3 mg/dL (ref 8.9–10.3)
Chloride: 104 mmol/L (ref 98–111)
Creatinine, Ser: 0.74 mg/dL (ref 0.44–1.00)
GFR, Estimated: >60 mL/min (ref >60)
Glucose, Bld: 95 mg/dL (ref 70–99)
Potassium: 4.4 mmol/L (ref 3.5–5.1)
Sodium: 137 mmol/L (ref 135–145)

## 2023-08-12 LAB — URINALYSIS, ROUTINE W REFLEX MICROSCOPIC
Bilirubin Urine: NEGATIVE
Glucose, UA: NEGATIVE mg/dL
Hgb urine dipstick: NEGATIVE
Ketones, ur: 20 mg/dL — AB
Leukocytes,Ua: NEGATIVE
Nitrite: NEGATIVE
Protein, ur: NEGATIVE mg/dL
Specific Gravity, Urine: 1.02 (ref 1.005–1.030)
pH: 7 (ref 5.0–8.0)

## 2023-08-12 MED ORDER — PREDNISONE 10 MG PO TABS: ORAL_TABLET | ORAL | 0 refills | Status: AC

## 2023-08-12 NOTE — Patient Instructions (Signed)
 Krista Adkins, thank you for joining Margaretann Loveless, PA-C for today's virtual visit.  While this provider is not your primary care provider (PCP), if your PCP is located in our provider database this encounter information will be shared with them immediately following your visit.   A [facility] MyChart account gives you access to today's visit and all your visits, tests, and labs performed at [facility] " click here if you don't have a [facility] MyChart account or go to [email or web address]  Consent: (Patient) Krista Adkins provided verbal consent for this virtual visit at the beginning of the encounter.  Current Medications:  Current Outpatient Medications:    predniSONE (DELTASONE) 10 MG tablet, Take 6 tabs PO on day 1&2, 5 tabs PO on day 3&4, 4 tabs PO on day 5&6, 3 tabs PO on day 7&8, 2 tabs PO on day 9&10, 1 tab PO on day 11&12., Disp: 42 tablet, Rfl: 0   acetaminophen (TYLENOL) 325 MG tablet, Take 650 mg by mouth every 6 (six) hours as needed for mild pain or headache., Disp: , Rfl:    Dimethyl Fumarate (TECFIDERA) 240 MG CPDR, Take 1 capsule (240 mg total) by mouth 2 (two) times daily., Disp: 60 capsule, Rfl: 11   diphenhydrAMINE (BENADRYL) 25 MG tablet, Take 25 mg by mouth every 6 (six) hours as needed for allergies., Disp: , Rfl:    ferrous sulfate 325 (65 FE) MG tablet, Take 325 mg by mouth every other day., Disp: , Rfl:    ibuprofen (ADVIL,MOTRIN) 600 MG tablet, Take 1 tablet (600 mg total) by mouth every 6 (six) hours., Disp: 30 tablet, Rfl: 0   Medications ordered in this encounter:  Meds ordered this encounter  Medications   predniSONE (DELTASONE) 10 MG tablet    Sig: Take 6 tabs PO on day 1&2, 5 tabs PO on day 3&4, 4 tabs PO on day 5&6, 3 tabs PO on day 7&8, 2 tabs PO on day 9&10, 1 tab PO on day 11&12.    Dispense:  42 tablet    Refill:  0    Supervising Provider:   Merrilee Jansky [[id number]]     *If you need refills on other medications prior  to your next appointment, please contact your pharmacy*  Follow-Up: Call back or seek an in-person evaluation if the symptoms worsen or if the condition fails to improve as anticipated.  [facility] [phone]  Other Instructions  Multiple Sclerosis Multiple sclerosis (MS) is a disease of the brain, spinal cord, and optic nerves (central nervous system). It causes the body's disease-fighting system (immunesystem) to destroy the protective covering around nerves in the brain (myelin sheath). When this happens, signals (nerve impulses) going to and from the brain and spinal cord do not get sent properly or may not get sent at all. There are several types of MS: Relapsing-remitting MS. This is the most common type. This causes sudden attacks of symptoms. After an attack, you may recover completely until the next attack, or some symptoms may remain permanently. Secondary progressive MS. This usually develops after the onset of relapsing-remitting MS. Similar to relapsing-remitting MS, this type also causes sudden attacks of symptoms. Attacks may be less frequent, but symptoms slowly get worse over time. Primary progressive MS. This causes symptoms that steadily progress over time. This type of MS does not cause sudden attacks of symptoms. The age of onset of MS varies, but it often develops between 85 and 40 years  of age. MS is a lifelong (chronic) condition. There is no cure, but treatment can help slow down the progression of the disease. What are the causes? The cause of this condition is not known. What increases the risk? You are more likely to develop this condition if: You are a woman. You have a relative with MS. However, the condition is not passed from parent to child (inherited). You have a lack (deficiency) of vitamin D. You smoke. MS is more common in the [place] than in the [place]. What are the signs or symptoms? Relapsing-remitting  and secondary progressive MS cause symptoms to occur in episodes or attacks that may last weeks to months. There may be long periods between attacks in which there are almost no symptoms. Primary progressive MS causes symptoms to steadily progress after they develop. Symptoms of MS vary because of the many different ways it affects the central nervous system. The main symptoms include: Vision problems and eye pain. Numbness and weakness. Inability to move your arms, hands, feet, or legs (paralysis). Balance problems. Shaking that you cannot control (tremors). Sudden muscle tightening (spasms). Problems with thinking (cognitive changes). MS can also cause symptoms that are associated with the disease but are not always the direct result of an MS attack. They may include: Inability to control when you urinate or have bowel movements (incontinence). Headaches. Fatigue. Inability to tolerate heat. Emotional changes. Depression. Pain. How is this diagnosed? This condition is diagnosed based on: Your symptoms. A neurological exam. This involves checking your central nervous system function, such as nerve function, reflexes, and coordination. MRIs of the brain and spinal cord. Lab tests, including a lumbar puncture that tests the fluid that surrounds the brain and spinal cord (cerebrospinal fluid). Tests to measure the electrical activity of the brain in response to stimulation (evoked potentials). How is this treated? There is no cure for MS, but medicines can help decrease the number and frequency of attacks and help relieve nuisance symptoms. Treatment options may include: Medicines that: Reduce the frequency of attacks. These medicines may be given by injection, by mouth (orally), or through an IV. Reduce inflammation (steroids). These may provide short-term relief of symptoms. Help control pain, depression, fatigue, or incontinence. Nutritional counseling. Eating a healthy, balanced diet  can help with symptoms. Taking vitamin D supplements, if you have a deficiency. Using devices to help you move around (assistive devices), such as braces, a cane, or a walker. Therapy, such as: Physical therapy to strengthen and stretch your muscles. Occupational therapy to help you with everyday tasks. Alternative or complementary treatments such as massage or acupuncture. Low-impact, mild exercises, such as swimming, walking, and yoga. Regular exercise can help alleviate symptoms and increase strength and balance. Follow these instructions at home: Medicines Take over-the-counter and prescription medicines only as told by your health care provider. Ask your health care provider if the medicine prescribed to you requires you to avoid driving or using machinery. Activity Use assistive devices as recommended by your physical therapist or your health care provider. Exercise as directed by your health care provider. Return to your normal activities as told by your health care provider. Ask your health care provider what activities are safe for you. General instructions Eating healthy can help manage MS symptoms. Reach out for support. Share your feelings with friends, family, or a support group. Keep all follow-up visits. This is important. Where to find more information National Multiple Sclerosis Society: www.nationalmssociety.[facility] of Neurological Disorders  and Stroke: [employer].co.uk [facility]: GasPicks.com.br Contact a health care provider if: You feel depressed. You develop new pain or numbness. You have tremors. You have problems with sexual function. Get help right away if: You develop paralysis. You develop numbness. You have problems with your bladder or bowel function. You develop double vision. You lose vision in one or both eyes. You develop suicidal thoughts. You develop severe confusion. Get help  right away if you feel like you may hurt yourself or others, or have thoughts about taking your own life. Go to your nearest emergency room or: Call 911. Call the National Suicide Prevention Lifeline at 7604534807 or 988. This is open 24 hours a day. Text the Crisis Text Line at 408 750 1989. Summary Multiple sclerosis (MS) is a disease of the central nervous system that causes the body's immune system to destroy the protective covering around nerves in the brain (myelin sheath). There are 3 types of MS: relapsing-remitting, secondary progressive, and primary progressive. There is no cure for MS, but medicines can help decrease the number and frequency of attacks and help relieve nuisance symptoms. Treatment may also include physical or occupational therapy. If you develop numbness, paralysis, vision problems, or other neurological symptoms, get help right away. This information is not intended to replace advice given to you by your health care provider. Make sure you discuss any questions you have with your health care provider. Document Revised: 12/26/2020 Document Reviewed: 12/26/2020 Elsevier Patient Education  2024 Elsevier Inc.   If you have been instructed to have an in-person evaluation today at a local Urgent Care facility, please use the link below. It will take you to a list of all of our available [facility], including address, phone number and hours of operation. Please do not delay care.  [facility]  If you or a family member do not have a primary care provider, use the link below to schedule a visit and establish care. When you choose a [facility] primary care physician or advanced practice provider, you gain a long-term partner in health. Find a Primary Care Provider  Learn more about [facility]'s in-office and virtual care options: [facility] - Get Care Now

## 2023-08-12 NOTE — ED Notes (Signed)
No answer x 3 for rooming

## 2023-08-12 NOTE — ED Triage Notes (Signed)
 Pt. Stated, Krista Adkins been feeling numb on the right side since Saturday. Im also having back pain and neck pain. Pt was able to use both sides equally. I have been diagnosed with multiple sclerosis in 2020. I know this is a symptom of it.

## 2023-08-12 NOTE — Progress Notes (Signed)
 Virtual Visit Consent   Krista Adkins, you are scheduled for a virtual visit with a [facility] provider today. Just as with appointments in the office, your consent must be obtained to participate. Your consent will be active for this visit and any virtual visit you may have with one of our providers in the next 365 days. If you have a MyChart account, a copy of this consent can be sent to you electronically.  As this is a virtual visit, video technology does not allow for your provider to perform a traditional examination. This may limit your provider's ability to fully assess your condition. If your provider identifies any concerns that need to be evaluated in person or the need to arrange testing (such as labs, EKG, etc.), we will make arrangements to do so. Although advances in technology are sophisticated, we cannot ensure that it will always work on either your end or our end. If the connection with a video visit is poor, the visit may have to be switched to a telephone visit. With either a video or telephone visit, we are not always able to ensure that we have a secure connection.  By engaging in this virtual visit, you consent to the provision of healthcare and authorize for your insurance to be billed (if applicable) for the services provided during this visit. Depending on your insurance coverage, you may receive a charge related to this service.  I need to obtain your verbal consent now. Are you willing to proceed with your visit today? Krista Adkins has provided verbal consent on 08/12/2023 for a virtual visit (video or telephone). Margaretann Loveless, PA-C  Date: 08/12/2023 12:46 PM   Virtual Visit via Video Note   I, Margaretann Loveless, connected with  Krista Adkins  ([id number], 1995-07-19) on 08/12/23 at 12:45 PM EDT by a video-enabled telemedicine application and verified that I am speaking with the correct person using two identifiers.  Location: Patient: Virtual Visit Location  Patient: Mobile Provider: Virtual Visit Location Provider: Home Office   I discussed the limitations of evaluation and management by telemedicine and the availability of in person appointments. The patient expressed understanding and agreed to proceed.    History of Present Illness: Krista Adkins is a 28 y.o. who identifies as a female who was assigned female at birth, and is being seen today for numbness on entire right side of body (arm, leg, right torso) that started on Saturday, 08/08/23. Also, having neck and back pain. PMH: MS, diagnosed in 2020; symptoms started in 2018. Feels this is consistent with a flare. Has not seen Neurology since 2022. Is no longer on Tecfidera to lessen relapses. Has used Gabapentin and muscle relaxers previously as well with only minimal relief. Last flare requiring steroids was in May 2020.  Went to ER today, baseline labs were normal (CBC and BMP). Left due to prolonged wait times.  Problems:  Patient Active Problem List   Diagnosis Date Noted   Multiple sclerosis (HCC) 09/30/2018   Indication for care in labor or delivery 09/29/2016   SVD (spontaneous vaginal delivery) 09/29/2016   Supervision of normal pregnancy, antepartum 03/17/2016    Allergies: No Known Allergies Medications:  Current Outpatient Medications:    predniSONE (DELTASONE) 10 MG tablet, Take 6 tabs PO on day 1&2, 5 tabs PO on day 3&4, 4 tabs PO on day 5&6, 3 tabs PO on day 7&8, 2 tabs PO on day 9&10, 1 tab PO on day 11&12., Disp:  42 tablet, Rfl: 0   acetaminophen (TYLENOL) 325 MG tablet, Take 650 mg by mouth every 6 (six) hours as needed for mild pain or headache., Disp: , Rfl:    Dimethyl Fumarate (TECFIDERA) 240 MG CPDR, Take 1 capsule (240 mg total) by mouth 2 (two) times daily., Disp: 60 capsule, Rfl: 11   diphenhydrAMINE (BENADRYL) 25 MG tablet, Take 25 mg by mouth every 6 (six) hours as needed for allergies., Disp: , Rfl:    ferrous sulfate 325 (65 FE) MG tablet, Take 325 mg by  mouth every other day., Disp: , Rfl:    ibuprofen (ADVIL,MOTRIN) 600 MG tablet, Take 1 tablet (600 mg total) by mouth every 6 (six) hours., Disp: 30 tablet, Rfl: 0  Observations/Objective: Patient is well-developed, well-nourished in no acute distress.  Resting comfortably  Head is normocephalic, atraumatic.  No labored breathing.  Speech is clear and coherent with logical content.  Patient is alert and oriented at baseline.    Assessment and Plan: 1. MS (multiple sclerosis) (HCC) (Primary) - predniSONE (DELTASONE) 10 MG tablet; Take 6 tabs PO on day 1&2, 5 tabs PO on day 3&4, 4 tabs PO on day 5&6, 3 tabs PO on day 7&8, 2 tabs PO on day 9&10, 1 tab PO on day 11&12.  Dispense: 42 tablet; Refill: 0  - Will prescribe Prednisone 12 day taper as this helped her in the past - Discussed importance of following up or re-establishing with a Neurologist to have her MS monitored and managed to prevent future flares and future progression of weakness or immobility - Strict ER precaution to follow up if symptoms do not improve, or if they continue to worsen, with Prednisone; voiced understanding  Follow Up Instructions: I discussed the assessment and treatment plan with the patient. The patient was provided an opportunity to ask questions and all were answered. The patient agreed with the plan and demonstrated an understanding of the instructions.  A copy of instructions were sent to the patient via MyChart unless otherwise noted below.    The patient was advised to call back or seek an in-person evaluation if the symptoms worsen or if the condition fails to improve as anticipated.    Margaretann Loveless, PA-C

## 2023-10-11 NOTE — Progress Notes (Unsigned)
 [facility]  PATIENT: Krista Adkins DOB: 03-27-1996  REFERRING DOCTOR OR PCP: Beaulah Bouquet, NP SOURCE: Patient, notes from primary care, imaging and lab reports, MRI images personally reviewed and summarized below  _________________________________   HISTORICAL  CHIEF COMPLAINT:  Chief Complaint  Patient presents with   New Patient (Initial Visit)    Pt in room 11. Boyfriend in room.  Paper referral for MS. Last seen in 2022. Diagnosed MS in 2020. Pt has been off tecfidera  for about 2 year, pt said April she had right side numbness. Pt also reports back and neck pain daily. Last eye exam was 2024. No falls.     HISTORY OF PRESENT ILLNESS:  I had the pleasure of seeing your patient, Krista Adkins, at the MS center [facility] for a neurologic consultation regarding her multiple sclerosis.  She is a 28 year old woman who began to experience episodes of left leg numbness in 2018 and milder symptoms elsewhere.  This reemerged in 2020 after she delivered a child..  The episode in 2020 also involve numbness in the left arm, unlike the other episodes that only involve the leg.  MRI was performed c/w MS and she was started on Tecfidera  in June 2020.  She stopped Tecfidera  in 2023 and then had insurance issues.   In April 2025, she had right arm, leg and face numbness that came on suddenly.    She had a virtual visit and was placed on  a low dose prednisone  taper.   She does not think there was any imporvement over the next couple weeks but over the past month symptoms have improved.  Currently, gait is back to normal though was off balanced in April 2025 due to foot/leg numbness.   She is driving again.  She can go downstairs without the bannister.   There is mild residual right sided numbness.  She has sharp pain down legs and some back pain.  Vision is normal with glasses.  No asymmetry.    She denies any bladder issues.  She notes ashe is more  forgetful but no severe cognitive issues.    Mood is fine.   She sleeps well.    She has occasional fatigue   Imaging: MRI 09/29/2019 shows T2/FLAIR hyperintense foci in the periventricular and deep white matter in a pattern and configuration consistent with chronic demyelinating plaque associated with multiple sclerosis. None of the foci enhance or appear to be acute. However, one focus involving the right internal capsule and adjacent thalamus is increased in size compared to the previous MRI (09/28/2018) while 2 other foci have an improved appearance during this time   MRI of the cervical spine 09/29/2019 shows Two foci within the spinal cord, one to the left just below the cervicomedullary junction and one centrally adjacent to the C6 vertebral body. Both of these were also apparent on the MRI from 09/23/2018. The C6 lesion was enhancing on the MRI from 09/23/2018   These are consistent with chronic demyelinating plaque associated with multiple sclerosis.  No significant degenerative change noted  MRI of the thoracic spine 09/23/2018 showed a subtle focus adjacent to T12.  REVIEW OF SYSTEMS: Constitutional: No fevers, chills, sweats, or change in appetite Eyes: No visual changes, double vision, eye pain Ear, nose and throat: No hearing loss, ear pain, nasal congestion, sore throat Cardiovascular: No chest pain, palpitations Respiratory:  No shortness of breath at rest or with exertion.   No wheezes GastrointestinaI: No nausea,  vomiting, diarrhea, abdominal pain, fecal incontinence Genitourinary:  No dysuria, urinary retention or frequency.  No nocturia. Musculoskeletal:  No neck pain, back pain Integumentary: No rash, pruritus, skin lesions Neurological: as above Psychiatric: No depression at this time.  No anxiety Endocrine: No palpitations, diaphoresis, change in appetite, change in weigh or increased thirst Hematologic/Lymphatic:  No anemia, purpura, petechiae. Allergic/Immunologic: No  itchy/runny eyes, nasal congestion, recent allergic reactions, rashes  ALLERGIES: No Known Allergies  HOME MEDICATIONS:  Current Outpatient Medications:    acetaminophen  (TYLENOL ) 325 MG tablet, Take 650 mg by mouth every 6 (six) hours as needed for mild pain or headache. (Patient not taking: Reported on 10/13/2023), Disp: , Rfl:    Dimethyl Fumarate  (TECFIDERA ) 240 MG CPDR, Take 1 capsule (240 mg total) by mouth 2 (two) times daily. (Patient not taking: Reported on 10/13/2023), Disp: 60 capsule, Rfl: 11   diphenhydrAMINE  (BENADRYL ) 25 MG tablet, Take 25 mg by mouth every 6 (six) hours as needed for allergies. (Patient not taking: Reported on 10/13/2023), Disp: , Rfl:    ferrous sulfate  325 (65 FE) MG tablet, Take 325 mg by mouth every other day. (Patient not taking: Reported on 10/13/2023), Disp: , Rfl:    ibuprofen  (ADVIL ,MOTRIN ) 600 MG tablet, Take 1 tablet (600 mg total) by mouth every 6 (six) hours. (Patient not taking: Reported on 10/13/2023), Disp: 30 tablet, Rfl: 0   predniSONE  (DELTASONE ) 10 MG tablet, Take 6 tabs PO on day 1&2, 5 tabs PO on day 3&4, 4 tabs PO on day 5&6, 3 tabs PO on day 7&8, 2 tabs PO on day 9&10, 1 tab PO on day 11&12. (Patient not taking: Reported on 10/13/2023), Disp: 42 tablet, Rfl: 0  PAST MEDICAL HISTORY: Past Medical History:  Diagnosis Date   Anemia    Medical history non-contributory    Multiple sclerosis (HCC) 09/30/2018   SVD (spontaneous vaginal delivery) 09/29/2016    PAST SURGICAL HISTORY: Past Surgical History:  Procedure Laterality Date   NO PAST SURGERIES      FAMILY HISTORY: History reviewed. No pertinent family history.  SOCIAL HISTORY: Social History   Socioeconomic History   Marital status: Single    Spouse name: Not on file   Number of children: Not on file   Years of education: Not on file   Highest education level: Not on file  Occupational History   Not on file  Tobacco Use   Smoking status: Never   Smokeless tobacco: Never   Vaping Use   Vaping status: Never Used  Substance and Sexual Activity   Alcohol use: No   Drug use: No    Comment: cbd   Sexual activity: Not on file    Comment: Nexplanon  Other Topics Concern   Not on file  Social History Narrative   Right handed    Wears glasses    Social Drivers of Health   Financial Resource Strain: Not on file  Food Insecurity: Not on file  Transportation Needs: Not on file  Physical Activity: Not on file  Stress: Not on file  Social Connections: Not on file  Intimate Partner Violence: Not on file       PHYSICAL EXAM  Vitals:   10/13/23 0853  BP: 113/65  Pulse: 61  Weight: 140 lb (63.5 kg)  Height: 5\' 6"  (1.676 m)    Body mass index is 22.6 kg/m.  Vision Screening   Right eye Left eye Both eyes  Without correction     With correction 20/20 20/20  20/20      General: The patient is well-developed and well-nourished and in no acute distress  HEENT:  Head is [place]/AT.  Sclera are anicteric.  Funduscopic exam shows normal optic discs and retinal vessels.  Neck: No carotid bruits are noted.  The neck is nontender.  Cardiovascular: The heart has a regular rate and rhythm with a normal S1 and S2. There were no murmurs, gallops or rubs.    Skin: Extremities are without rash or edema.  Musculoskeletal:  Back is nontender  Neurologic Exam  Mental status: The patient is alert and oriented x 3 at the time of the examination. The patient has apparent normal recent and remote memory, with an apparently normal attention span and concentration ability.   Speech is normal.  Cranial nerves: Extraocular movements are full. Pupils are equal, round, and reactive to light and accomodation.  Color vision was symmetric .  Facial symmetry is present. There is good facial sensation to soft touch bilaterally.Facial strength is normal.  Trapezius and sternocleidomastoid strength is normal. No dysarthria is noted.  The tongue is midline, and the patient has  symmetric elevation of the soft palate. No obvious hearing deficits are noted.  Motor:  Muscle bulk is normal.   Tone is normal. Strength is  5 / 5 in all 4 extremities.   Sensory: Sensory testing is intact to pinprick, soft touch and vibration sensation in all 4 extremities.  Coordination: Cerebellar testing reveals good finger-nose-finger and heel-to-shin bilaterally.  Gait and station: Station is normal.   Gait is normal. Tandem gait is mildly wide. Romberg is negative.   Reflexes: Deep tendon reflexes are symmetric and normal in the arms and increased at the knees.  No ankle clonus..   Plantar responses are flexor.    DIAGNOSTIC DATA (LABS, IMAGING, TESTING) - I reviewed patient records, labs, notes, testing and imaging myself where available.  Lab Results  Component Value Date   WBC 5.9 08/12/2023   HGB 13.8 08/12/2023   HCT 41.3 08/12/2023   MCV 92.8 08/12/2023   PLT 261 08/12/2023      Component Value Date/Time   NA 137 08/12/2023 0904   NA 142 02/15/2020 0904   K 4.4 08/12/2023 0904   CL 104 08/12/2023 0904   CO2 24 08/12/2023 0904   GLUCOSE 95 08/12/2023 0904   BUN 11 08/12/2023 0904   BUN 11 02/15/2020 0904   CREATININE 0.74 08/12/2023 0904   CALCIUM 9.3 08/12/2023 0904   PROT 7.0 02/15/2020 0904   ALBUMIN 4.5 02/15/2020 0904   AST 17 02/15/2020 0904   ALT 15 02/15/2020 0904   ALKPHOS 50 02/15/2020 0904   BILITOT 0.3 02/15/2020 0904   GFRNONAA >60 08/12/2023 0904   GFRAA 123 02/15/2020 0904   No results found for: "CHOL", "HDL", "LDLCALC", "LDLDIRECT", "TRIG", "CHOLHDL" No results found for: "HGBA1C" Lab Results  Component Value Date   VITAMINB12 617 07/23/2018   No results found for: "TSH"     ASSESSMENT AND PLAN  Multiple sclerosis (HCC) - Plan: MR BRAIN W WO CONTRAST, MR CERVICAL SPINE W WO CONTRAST, Varicella zoster antibody, IgG, HIV Antibody (routine testing w rflx), QuantiFERON-TB Gold Plus, Hep B Surface Antigen, Hepatitis B surface  antibody,qualitative, Hepatitis B Core AB, Total, IgG, IgA, IgM, CBC with Differential/Platelet, Comprehensive metabolic panel with GFR  High risk medication use - Plan: Varicella zoster antibody, IgG, HIV Antibody (routine testing w rflx), QuantiFERON-TB Gold Plus, Hep B Surface Antigen, Hepatitis B surface antibody,qualitative, Hepatitis B Core  AB, Total, IgG, IgA, IgM, CBC with Differential/Platelet, Comprehensive metabolic panel with GFR  Ataxic gait - Plan: MR BRAIN W WO CONTRAST, MR CERVICAL SPINE W WO CONTRAST  Numbness - Plan: MR BRAIN W WO CONTRAST, MR CERVICAL SPINE W WO CONTRAST  Encounter for screening for human immunodeficiency virus (HIV) - Plan: HIV Antibody (routine testing w rflx)  Vitamin D deficiency - Plan: VITAMIN D 25 Hydroxy (Vit-D Deficiency, Fractures)   In summary, [name] is a 28 year old woman who was diagnosed with MS in 2020 after presenting with sensory symptoms and an abnormal MRI but likely began to experience symptoms in 2018 at age 71.  She initially was on Tecfidera  but has been off all disease modifying therapy since 2023.  Unfortunately, she did have a significant exacerbation in April 2025 involving severe right sided numbness, clumsiness and gait disturbance.  This mapped out best to the brainstem but could also have been in the upper cervical spine.  Regardless, with several exacerbations she needs to go on a highly effective disease modifying therapy.  We discussed options.  She will begin Kesimpta.  I will check some blood work to make sure that she does not have a chronic infection and to determine the IgG/IgM and differential.  She did get a varicella antibody checked which was positive several years ago.  I will check the MRI of the brain and cervical spine to ensure that there is not any alternative explanation to her symptoms that would send us  down a different path.  We went over symptoms that could represent an exacerbation and I advised her to call  me if she has any of these occurring.  She will return to see me in 6 months for regular visit but call sooner for new or worsening neurologic symptoms.  Thank you for asking me to see Ms. Janak.  Please let me know if I can be of further assistance with her or other patients in the future.   Avagrace Botelho A. Godwin Lat, MD, [facility] 10/13/2023, 10:08 AM Certified in Neurology, Clinical Neurophysiology, Sleep Medicine and Neuroimaging  [facility] [address], [address] [place], [place] [address] [phone]

## 2023-10-13 ENCOUNTER — Telehealth: Payer: Self-pay | Admitting: Neurology

## 2023-10-13 ENCOUNTER — Ambulatory Visit: Admitting: Neurology

## 2023-10-13 ENCOUNTER — Encounter: Payer: Self-pay | Admitting: Neurology

## 2023-10-13 VITALS — BP 113/65 | HR 61 | Ht 66.0 in | Wt 140.0 lb

## 2023-10-13 DIAGNOSIS — R260 Ataxic gait: Secondary | ICD-10-CM

## 2023-10-13 DIAGNOSIS — Z114 Encounter for screening for human immunodeficiency virus [HIV]: Secondary | ICD-10-CM

## 2023-10-13 DIAGNOSIS — Z79899 Other long term (current) drug therapy: Secondary | ICD-10-CM | POA: Diagnosis not present

## 2023-10-13 DIAGNOSIS — G35 Multiple sclerosis: Secondary | ICD-10-CM | POA: Diagnosis not present

## 2023-10-13 DIAGNOSIS — R200 Anesthesia of skin: Secondary | ICD-10-CM

## 2023-10-13 DIAGNOSIS — E559 Vitamin D deficiency, unspecified: Secondary | ICD-10-CM

## 2023-10-13 NOTE — Telephone Encounter (Signed)
 MRI cervical [employer] medicaid [employer]: [id number] exp. 10/13/23-11/27/23  MRI brain [facility] medicaid auth: [id number] exp. 10/13/23-11/27/23 sent to GI [phone]

## 2023-10-18 LAB — HEPATITIS B SURFACE ANTIGEN: Hepatitis B Surface Ag: NEGATIVE

## 2023-10-18 LAB — CBC WITH DIFFERENTIAL/PLATELET
Basophils Absolute: 0.1 10*3/uL (ref 0.0–0.2)
Basos: 1 %
EOS (ABSOLUTE): 0.3 10*3/uL (ref 0.0–0.4)
Eos: 5 %
Hematocrit: 43.3 % (ref 34.0–46.6)
Hemoglobin: 13.9 g/dL (ref 11.1–15.9)
Immature Grans (Abs): 0 10*3/uL (ref 0.0–0.1)
Immature Granulocytes: 0 %
Lymphocytes Absolute: 2.4 10*3/uL (ref 0.7–3.1)
Lymphs: 40 %
MCH: 31 pg (ref 26.6–33.0)
MCHC: 32.1 g/dL (ref 31.5–35.7)
MCV: 97 fL (ref 79–97)
Monocytes Absolute: 0.4 10*3/uL (ref 0.1–0.9)
Monocytes: 7 %
Neutrophils Absolute: 2.8 10*3/uL (ref 1.4–7.0)
Neutrophils: 47 %
Platelets: 235 10*3/uL (ref 150–450)
RBC: 4.48 x10E6/uL (ref 3.77–5.28)
RDW: 11.2 % — ABNORMAL LOW (ref 11.7–15.4)
WBC: 6 10*3/uL (ref 3.4–10.8)

## 2023-10-18 LAB — COMPREHENSIVE METABOLIC PANEL WITH GFR
ALT: 17 IU/L (ref 0–32)
AST: 21 IU/L (ref 0–40)
Albumin: 4.5 g/dL (ref 4.0–5.0)
Alkaline Phosphatase: 44 IU/L (ref 44–121)
BUN/Creatinine Ratio: 12 (ref 9–23)
BUN: 9 mg/dL (ref 6–20)
Bilirubin Total: 0.5 mg/dL (ref 0.0–1.2)
CO2: 22 mmol/L (ref 20–29)
Calcium: 9.4 mg/dL (ref 8.7–10.2)
Chloride: 104 mmol/L (ref 96–106)
Creatinine, Ser: 0.75 mg/dL (ref 0.57–1.00)
Globulin, Total: 2.6 g/dL (ref 1.5–4.5)
Glucose: 79 mg/dL (ref 70–99)
Potassium: 4.3 mmol/L (ref 3.5–5.2)
Sodium: 140 mmol/L (ref 134–144)
Total Protein: 7.1 g/dL (ref 6.0–8.5)
eGFR: 113 mL/min/{1.73_m2} (ref >59)

## 2023-10-18 LAB — QUANTIFERON-TB GOLD PLUS
QuantiFERON Mitogen Value: >10 [IU]/mL
QuantiFERON Nil Value: 0.07 [IU]/mL
QuantiFERON TB1 Ag Value: 0.06 [IU]/mL
QuantiFERON TB2 Ag Value: 0.07 [IU]/mL
QuantiFERON-TB Gold Plus: NEGATIVE

## 2023-10-18 LAB — HEPATITIS B SURFACE ANTIBODY,QUALITATIVE: Hep B Surface Ab, Qual: REACTIVE

## 2023-10-18 LAB — IGG, IGA, IGM
IgA/Immunoglobulin A, Serum: 141 mg/dL (ref 87–352)
IgG (Immunoglobin G), Serum: 1257 mg/dL (ref 586–1602)
IgM (Immunoglobulin M), Srm: 177 mg/dL (ref 26–217)

## 2023-10-18 LAB — HEPATITIS B CORE ANTIBODY, TOTAL: Hep B Core Total Ab: NEGATIVE

## 2023-10-18 LAB — HIV ANTIBODY (ROUTINE TESTING W REFLEX): HIV Screen 4th Generation wRfx: NONREACTIVE

## 2023-10-18 LAB — VARICELLA ZOSTER ANTIBODY, IGG: Varicella zoster IgG: REACTIVE

## 2023-10-18 LAB — VITAMIN D 25 HYDROXY (VIT D DEFICIENCY, FRACTURES): Vit D, 25-Hydroxy: 18.3 ng/mL — ABNORMAL LOW (ref 30.0–100.0)

## 2023-10-19 ENCOUNTER — Ambulatory Visit: Payer: Self-pay | Admitting: Neurology

## 2023-10-19 ENCOUNTER — Other Ambulatory Visit: Payer: Self-pay | Admitting: Neurology

## 2023-10-19 DIAGNOSIS — G379 Demyelinating disease of central nervous system, unspecified: Secondary | ICD-10-CM

## 2023-10-19 MED ORDER — VITAMIN D (ERGOCALCIFEROL) 1.25 MG (50000 UNIT) PO CAPS: 50000.0000 [IU] | ORAL_CAPSULE | ORAL | 1 refills | Status: AC

## 2023-10-20 ENCOUNTER — Telehealth: Payer: Self-pay | Admitting: *Deleted

## 2023-10-20 NOTE — Telephone Encounter (Signed)
Faxed completed/signed Kesimpta start form to [facility] at [phone]. Received fax confirmation.  

## 2023-10-20 NOTE — Telephone Encounter (Signed)
 Called and spoke w/ pt about lab results per Dr Godwin Lat note. Pt verbalized understanding. Aware we will fax Kesimpta start form to [facility] who will then contact her. Will need to get approval via insurance and then med will be shipped from [facility] mail order. She verbalized understanding.

## 2023-10-20 NOTE — Telephone Encounter (Signed)
-----   Message from Jorie Newness sent at 10/19/2023  5:54 PM EDT -----  Blood work is fine for her to go ahead and begin [employer].  Her vitamin D  was low and I am going to send in a high-strength supplement to her drugstore ----- Message ----- From: Interface, [facility] Lab Results In Sent: 10/14/2023   7:37 AM EDT To: Jorie Newness, MD

## 2023-10-20 NOTE — Telephone Encounter (Signed)
 PA not needed for Kesimpta:

## 2023-10-20 NOTE — Telephone Encounter (Signed)
 PA for Kesimpta completed via CMM. Sent to [facility]. Key: [id number]. Should have a determination within 3-5 business days.

## 2023-11-17 ENCOUNTER — Ambulatory Visit
Admission: RE | Admit: 2023-11-17 | Discharge: 2023-11-17 | Disposition: A | Discharge status: Home / Self Care | Source: Ambulatory Visit | Attending: Neurology | Admitting: Neurology

## 2023-11-17 DIAGNOSIS — R260 Ataxic gait: Secondary | ICD-10-CM

## 2023-11-17 DIAGNOSIS — R200 Anesthesia of skin: Secondary | ICD-10-CM

## 2023-11-17 DIAGNOSIS — G35 Multiple sclerosis: Secondary | ICD-10-CM | POA: Diagnosis not present

## 2023-11-17 MED ORDER — GADOPICLENOL 0.5 MMOL/ML IV SOLN
7.5000 mL | Freq: Once | INTRAVENOUS | Status: AC | PRN
  Administered 2023-11-17: 7.5 mL via INTRAVENOUS

## 2023-11-18 NOTE — Telephone Encounter (Addendum)
 Called pt and relayed the below MRI Results per Dr. Vear. Pt verbalized understanding.   ----- Message from Charlie DELENA Vear sent at 11/18/2023  2:23 PM EDT ----- Please let her know that the MRI of the spinal cord.  It showed two old MS lesion but no new ones.   The MRI of the brain showed 1 new spot that was not there on the last MRI and one of the lesions  looked a little bit bigger.  We will want to check another MRI in 9 to 12 months to see how you are doing on the Kesimpta.  I also want to check 1 additional test (anti-MOG) and I have placed the order.  She can stop by or office to get this done anytime during normal business hours ----- Message ----- From: Vear Charlie DELENA, MD Sent: 11/17/2023   8:31 PM EDT To: Charlie DELENA Vear, MD

## 2023-11-18 NOTE — Addendum Note (Signed)
 Addended by: VEAR CHARLIE LABOR on: 11/18/2023 02:23 PM   Modules accepted: Orders

## 2023-11-19 ENCOUNTER — Other Ambulatory Visit (INDEPENDENT_AMBULATORY_CARE_PROVIDER_SITE_OTHER): Payer: Self-pay

## 2023-11-19 DIAGNOSIS — Z0289 Encounter for other administrative examinations: Secondary | ICD-10-CM

## 2023-11-19 DIAGNOSIS — G379 Demyelinating disease of central nervous system, unspecified: Secondary | ICD-10-CM

## 2023-11-21 ENCOUNTER — Ambulatory Visit: Payer: Self-pay | Admitting: Neurology

## 2023-11-21 LAB — ANTI-MOG, SERUM: MOG Antibody, Cell-based IFA: NEGATIVE

## 2023-11-23 NOTE — Addendum Note (Signed)
 Addended by: MINGO NEPTUNE A on: 11/23/2023 11:54 AM   Modules accepted: Orders

## 2024-01-25 ENCOUNTER — Encounter: Payer: Self-pay | Admitting: Neurology

## 2024-02-08 ENCOUNTER — Telehealth: Payer: Self-pay | Admitting: Neurology

## 2024-02-08 NOTE — Telephone Encounter (Signed)
 MYC cxl

## 2024-03-07 NOTE — Telephone Encounter (Signed)
 Patient reschedule appointment on 05/23/24 at 11:00 am

## 2024-04-26 ENCOUNTER — Ambulatory Visit: Admitting: Neurology

## 2024-05-23 ENCOUNTER — Ambulatory Visit: Admitting: Neurology

## 2024-05-23 ENCOUNTER — Encounter: Payer: Self-pay | Admitting: Neurology

## 2024-05-23 VITALS — BP 126/78 | HR 67 | Ht 67.0 in | Wt 139.5 lb

## 2024-05-23 DIAGNOSIS — Z79899 Other long term (current) drug therapy: Secondary | ICD-10-CM | POA: Diagnosis not present

## 2024-05-23 DIAGNOSIS — G35A Relapsing-remitting multiple sclerosis: Secondary | ICD-10-CM | POA: Diagnosis not present

## 2024-05-23 DIAGNOSIS — R200 Anesthesia of skin: Secondary | ICD-10-CM | POA: Diagnosis not present

## 2024-05-23 NOTE — Progress Notes (Signed)
 "  [facility]  PATIENT: Krista Adkins DOB: July 05, 1995  REFERRING DOCTOR OR PCP: Kevin Danker, NP SOURCE: Patient, notes from primary care, imaging and lab reports, MRI images personally reviewed and summarized below  _________________________________   HISTORICAL  CHIEF COMPLAINT:  Chief Complaint  Patient presents with   Follow-up    Room 10 Alone MS    HISTORY OF PRESENT ILLNESS:  Krista Adkins is a 29 y.o. woman with relapsing remitting multiple sclerosis.   At thelast visit, she started Kesimpta and is tolerating it well..   No new MS related symptoms or exacerbations.   Currently, gait is doing well ad strength is fine.  She has no difficulty with stairs now.  She keeps up with others on long walks.   No numbness but occasional sharp pain in backs and legs, sometimes in arms.   Possibly Lhermitte as sometimes worse with neck flex - but not always.    Vision is normal with glasses.  No asymmetry.    She denies any bladder issues.  She feels cognition is essentially back to normal and not noting issues with memory or focus now.     Mood is fine.   She sleeps well.    She has occasional fatigue   MS History: She  began to experience episodes of left leg numbness in 2018 and milder symptoms elsewhere.  This reemerged in 2020 after she delivered a child..  The episode in 2020 also involve numbness in the left arm, unlike the other episodes that only involve the leg.  MRI was performed c/w MS and she was started on Tecfidera  in June 2020.  She stopped Tecfidera  in 2023 and then had insurance issues.   In April 2025, she had right arm, leg and face numbness that came on suddenly.    She had a virtual visit and was placed on  a low dose prednisone  taper.   She does not think there was any imporvement over the next couple weeks but over the past month symptoms have improved.SABRA   Kesimpta was started in mid 2024   Imaging: MRI 09/29/2019 shows T2/FLAIR  hyperintense foci in the periventricular and deep white matter in a pattern and configuration consistent with chronic demyelinating plaque associated with multiple sclerosis. None of the foci enhance or appear to be acute. However, one focus involving the right internal capsule and adjacent thalamus is increased in size compared to the previous MRI (09/28/2018) while 2 other foci have an improved appearance during this time   MRI of the cervical spine 09/29/2019 shows Two foci within the spinal cord, one to the left just below the cervicomedullary junction and one centrally adjacent to the C6 vertebral body. Both of these were also apparent on the MRI from 09/23/2018. The C6 lesion was enhancing on the MRI from 09/23/2018   These are consistent with chronic demyelinating plaque associated with multiple sclerosis.  No significant degenerative change noted  MRI of the thoracic spine 09/23/2018 showed a subtle focus adjacent to T12.  MRI cervical spine 11/17/2023 showed T2 hyperintense foci within the spinal cord adjacent to C6 and just below the cervicomedullary junction. Both of these were present on the MRI from 2020. Neither focus enhances on the current study though the focus adjacent to C6 partially enhanced on the MRI from 09/23/2018. Ri  MRI brain 11/17/2023 showed Multiple T2/FLAIR hyperintense foci in the cerebral hemispheres. They do not enhance and are consistent with chronic demyelination from multiple sclerosis or  other disorder. The focus in the left parietal lobe is larger than it was in 2020 which could reflect superimposed lesions or progression.     REVIEW OF SYSTEMS: Constitutional: No fevers, chills, sweats, or change in appetite Eyes: No visual changes, double vision, eye pain Ear, nose and throat: No hearing loss, ear pain, nasal congestion, sore throat Cardiovascular: No chest pain, palpitations Respiratory:  No shortness of breath at rest or with exertion.   No  wheezes GastrointestinaI: No nausea, vomiting, diarrhea, abdominal pain, fecal incontinence Genitourinary:  No dysuria, urinary retention or frequency.  No nocturia. Musculoskeletal:  No neck pain, back pain Integumentary: No rash, pruritus, skin lesions Neurological: as above Psychiatric: No depression at this time.  No anxiety Endocrine: No palpitations, diaphoresis, change in appetite, change in weigh or increased thirst Hematologic/Lymphatic:  No anemia, purpura, petechiae. Allergic/Immunologic: No itchy/runny eyes, nasal congestion, recent allergic reactions, rashes  ALLERGIES: No Known Allergies  HOME MEDICATIONS:  Current Outpatient Medications:    acetaminophen  (TYLENOL ) 325 MG tablet, Take 650 mg by mouth every 6 (six) hours as needed for mild pain or headache., Disp: , Rfl:    diphenhydrAMINE  (BENADRYL ) 25 MG tablet, Take 25 mg by mouth every 6 (six) hours as needed for allergies., Disp: , Rfl:    ferrous sulfate  325 (65 FE) MG tablet, Take 325 mg by mouth every other day., Disp: , Rfl:    ibuprofen  (ADVIL ,MOTRIN ) 600 MG tablet, Take 1 tablet (600 mg total) by mouth every 6 (six) hours., Disp: 30 tablet, Rfl: 0   Ofatumumab (KESIMPTA) 20 MG/0.4ML SOAJ, Inject 20 mg into the skin every 28 (twenty-eight) days., Disp: , Rfl:    predniSONE  (DELTASONE ) 10 MG tablet, Take 6 tabs PO on day 1&2, 5 tabs PO on day 3&4, 4 tabs PO on day 5&6, 3 tabs PO on day 7&8, 2 tabs PO on day 9&10, 1 tab PO on day 11&12., Disp: 42 tablet, Rfl: 0   Vitamin D , Ergocalciferol , (DRISDOL ) 1.25 MG (50000 UNIT) CAPS capsule, Take 1 capsule (50,000 Units total) by mouth every 7 (seven) days., Disp: 13 capsule, Rfl: 1  PAST MEDICAL HISTORY: Past Medical History:  Diagnosis Date   Anemia    Medical history non-contributory    Multiple sclerosis 09/30/2018   SVD (spontaneous vaginal delivery) 09/29/2016    PAST SURGICAL HISTORY: Past Surgical History:  Procedure Laterality Date   NO PAST SURGERIES       FAMILY HISTORY: History reviewed. No pertinent family history.  SOCIAL HISTORY: Social History   Socioeconomic History   Marital status: Single    Spouse name: Not on file   Number of children: Not on file   Years of education: Not on file   Highest education level: Not on file  Occupational History   Not on file  Tobacco Use   Smoking status: Never   Smokeless tobacco: Never  Vaping Use   Vaping status: Never Used  Substance and Sexual Activity   Alcohol use: No   Drug use: No    Comment: cbd   Sexual activity: Not on file    Comment: Nexplanon  Other Topics Concern   Not on file  Social History Narrative   Right handed    Wears glasses    Social Drivers of Health   Tobacco Use: Low Risk (05/23/2024)   Patient History    Smoking Tobacco Use: Never    Smokeless Tobacco Use: Never    Passive Exposure: Not on file  Financial Resource  Strain: Not on file  Food Insecurity: Not on file  Transportation Needs: Not on file  Physical Activity: Not on file  Stress: Not on file  Social Connections: Not on file  Intimate Partner Violence: Not on file  Depression (EYV7-0): Not on file  Alcohol Screen: Not on file  Housing: Not on file  Utilities: Not on file  Health Literacy: Not on file       PHYSICAL EXAM  Vitals:   05/23/24 1054  BP: 126/78  Pulse: 67  SpO2: 98%  Weight: 139 lb 8 oz (63.3 kg)  Height: 5' 7 (1.702 m)     Body mass index is 21.85 kg/m.  No results found.     General: The patient is well-developed and well-nourished and in no acute distress  HEENT:  Head is [place]/AT.  Sclera are anicteric.  Neck: The neck is nontender..  Good range of motion.   Skin: Extremities are without rash or edema.  Musculoskeletal:  Back is nontender  Neurologic Exam  Mental status: The patient is alert and oriented x 3 at the time of the examination. The patient has apparent normal recent and remote memory, with an apparently normal attention span  and concentration ability.   Speech is normal.  Cranial nerves: Extraocular movements are full. There is good facial sensation to soft touch bilaterally.Facial strength is normal.  Trapezius and sternocleidomastoid strength is normal. No dysarthria is noted.   No obvious hearing deficits are noted.  Motor:  Muscle bulk is normal.   Tone is normal. Strength is  5 / 5 in all 4 extremities.   Sensory: Sensory testing is intact to pinprick, soft touch and vibration sensation in all 4 extremities.  Coordination: Cerebellar testing reveals good finger-nose-finger and heel-to-shin bilaterally.  Gait and station: Station is normal.   Gait is normal. Tandem gait is very slightly wide. Romberg is negative.   Reflexes: Deep tendon reflexes are symmetric and normal in the arms and increased at the knees.  No ankle clonus.SABRA        DIAGNOSTIC DATA (LABS, IMAGING, TESTING) - I reviewed patient records, labs, notes, testing and imaging myself where available.  Lab Results  Component Value Date   WBC 6.0 10/13/2023   HGB 13.9 10/13/2023   HCT 43.3 10/13/2023   MCV 97 10/13/2023   PLT 235 10/13/2023      Component Value Date/Time   NA 140 10/13/2023 0957   K 4.3 10/13/2023 0957   CL 104 10/13/2023 0957   CO2 22 10/13/2023 0957   GLUCOSE 79 10/13/2023 0957   GLUCOSE 95 08/12/2023 0904   BUN 9 10/13/2023 0957   CREATININE 0.75 10/13/2023 0957   CALCIUM 9.4 10/13/2023 0957   PROT 7.1 10/13/2023 0957   ALBUMIN 4.5 10/13/2023 0957   AST 21 10/13/2023 0957   ALT 17 10/13/2023 0957   ALKPHOS 44 10/13/2023 0957   BILITOT 0.5 10/13/2023 0957   GFRNONAA >60 08/12/2023 0904   GFRAA 123 02/15/2020 0904   No results found for: CHOL, HDL, LDLCALC, LDLDIRECT, TRIG, CHOLHDL No results found for: YHAJ8R Lab Results  Component Value Date   VITAMINB12 617 07/23/2018   No results found for: TSH     ASSESSMENT AND PLAN  Multiple sclerosis, relapsing-remitting - Plan: IgG, IgA,  IgM, CBC with Differential/Platelet  High risk medication use - Plan: IgG, IgA, IgM, CBC with Differential/Platelet  Numbness   She will continue Kesimpta.  We will check some blood work today.  Around the  time of the next visit we will recheck CBC with differential, IgG/IgM and also consider MRI imaging to determine if there is any subclinical progression.  If this is occurring we would have to consider a different disease modifying therapy. Stay active and exercise as tolerated. She will return in 6 months or sooner if there are new or worsening neurologic symptoms.   Sydney Hasten A. Vear, MD, [facility] 05/23/2024, 11:18 AM Certified in Neurology, Clinical Neurophysiology, Sleep Medicine and Neuroimaging  [facility] [address] [place], [place] [address] [phone] "

## 2024-05-24 ENCOUNTER — Ambulatory Visit: Payer: Self-pay | Admitting: Neurology

## 2024-05-24 LAB — CBC WITH DIFFERENTIAL/PLATELET
Basophils Absolute: 0 x10E3/uL (ref 0.0–0.2)
Basos: 1 %
EOS (ABSOLUTE): 0.1 x10E3/uL (ref 0.0–0.4)
Eos: 2 %
Hematocrit: 41.6 % (ref 34.0–46.6)
Hemoglobin: 13.2 g/dL (ref 11.1–15.9)
Immature Grans (Abs): 0 x10E3/uL (ref 0.0–0.1)
Immature Granulocytes: 0 %
Lymphocytes Absolute: 0.9 x10E3/uL (ref 0.7–3.1)
Lymphs: 15 %
MCH: 31.7 pg (ref 26.6–33.0)
MCHC: 31.7 g/dL (ref 31.5–35.7)
MCV: 100 fL — ABNORMAL HIGH (ref 79–97)
Monocytes Absolute: 0.4 x10E3/uL (ref 0.1–0.9)
Monocytes: 7 %
Neutrophils Absolute: 4.1 x10E3/uL (ref 1.4–7.0)
Neutrophils: 75 %
Platelets: 231 x10E3/uL (ref 150–450)
RBC: 4.16 x10E6/uL (ref 3.77–5.28)
RDW: 12.3 % (ref 11.7–15.4)
WBC: 5.5 x10E3/uL (ref 3.4–10.8)

## 2024-05-24 LAB — IGG, IGA, IGM
IgG (Immunoglobin G), Serum: 1303 mg/dL (ref 586–1602)
IgM (Immunoglobulin M), Srm: 171 mg/dL (ref 26–217)
Immunoglobulin A, (IgA) QN, Serum: 141 mg/dL (ref 87–352)

## 2025-01-24 ENCOUNTER — Ambulatory Visit: Admitting: Neurology
